# Patient Record
Sex: Female | Born: 1985 | Race: Black or African American | Hispanic: No | Marital: Single | Smoking: Never smoker
Health system: Southern US, Community
[De-identification: ages and names within clinical notes are randomized; demographics above are authoritative.]

## PROBLEM LIST (undated history)

## (undated) DIAGNOSIS — J45909 Unspecified asthma, uncomplicated: Secondary | ICD-10-CM

## (undated) DIAGNOSIS — I253 Aneurysm of heart: Secondary | ICD-10-CM

## (undated) DIAGNOSIS — F419 Anxiety disorder, unspecified: Secondary | ICD-10-CM

## (undated) DIAGNOSIS — R011 Cardiac murmur, unspecified: Secondary | ICD-10-CM

## (undated) DIAGNOSIS — D259 Leiomyoma of uterus, unspecified: Secondary | ICD-10-CM

## (undated) DIAGNOSIS — Q600 Renal agenesis, unilateral: Secondary | ICD-10-CM

## (undated) DIAGNOSIS — L309 Dermatitis, unspecified: Secondary | ICD-10-CM

---

## 2006-04-15 ENCOUNTER — Other Ambulatory Visit: Admission: RE | Admit: 2006-04-15 | Discharge: 2006-04-15 | Payer: Self-pay | Admitting: *Deleted

## 2006-09-21 ENCOUNTER — Emergency Department (HOSPITAL_COMMUNITY): Admission: EM | Admit: 2006-09-21 | Discharge: 2006-09-22 | Payer: Self-pay | Admitting: Emergency Medicine

## 2008-05-07 ENCOUNTER — Emergency Department (HOSPITAL_COMMUNITY): Admission: EM | Admit: 2008-05-07 | Discharge: 2008-05-07 | Payer: Self-pay | Admitting: Emergency Medicine

## 2008-07-11 LAB — CONVERTED CEMR LAB

## 2009-04-21 ENCOUNTER — Emergency Department (HOSPITAL_COMMUNITY): Admission: EM | Admit: 2009-04-21 | Discharge: 2009-04-22 | Payer: Self-pay | Admitting: Emergency Medicine

## 2009-04-26 ENCOUNTER — Emergency Department (HOSPITAL_COMMUNITY): Admission: EM | Admit: 2009-04-26 | Discharge: 2009-04-27 | Payer: Self-pay | Admitting: Emergency Medicine

## 2009-08-17 ENCOUNTER — Emergency Department (HOSPITAL_COMMUNITY): Admission: EM | Admit: 2009-08-17 | Discharge: 2009-08-18 | Payer: Self-pay | Admitting: Emergency Medicine

## 2009-08-21 ENCOUNTER — Encounter (INDEPENDENT_AMBULATORY_CARE_PROVIDER_SITE_OTHER): Payer: Self-pay | Admitting: *Deleted

## 2009-08-29 ENCOUNTER — Ambulatory Visit: Payer: Self-pay | Admitting: Family

## 2009-08-29 DIAGNOSIS — R609 Edema, unspecified: Secondary | ICD-10-CM

## 2009-08-29 DIAGNOSIS — L2089 Other atopic dermatitis: Secondary | ICD-10-CM

## 2009-08-29 DIAGNOSIS — IMO0001 Reserved for inherently not codable concepts without codable children: Secondary | ICD-10-CM

## 2009-08-29 DIAGNOSIS — R599 Enlarged lymph nodes, unspecified: Secondary | ICD-10-CM | POA: Insufficient documentation

## 2009-08-29 LAB — CONVERTED CEMR LAB
ANA Titer 1: NEGATIVE
HCV Ab: NEGATIVE
HDL: 61.1 mg/dL (ref >39.00)
Hepatitis B Surface Ag: NEGATIVE
LDL Cholesterol: 68 mg/dL (ref 0–99)
Rhuematoid fact SerPl-aCnc: 25.1 intl units/mL — ABNORMAL HIGH (ref 0.0–20.0)
Total CHOL/HDL Ratio: 2

## 2009-08-31 ENCOUNTER — Encounter: Payer: Self-pay | Admitting: Family

## 2009-09-08 ENCOUNTER — Encounter (INDEPENDENT_AMBULATORY_CARE_PROVIDER_SITE_OTHER): Payer: Self-pay | Admitting: *Deleted

## 2009-09-15 ENCOUNTER — Telehealth: Payer: Self-pay | Admitting: Family

## 2009-09-27 ENCOUNTER — Emergency Department (HOSPITAL_BASED_OUTPATIENT_CLINIC_OR_DEPARTMENT_OTHER): Admission: EM | Admit: 2009-09-27 | Discharge: 2009-09-27 | Payer: Self-pay | Admitting: Emergency Medicine

## 2009-09-27 ENCOUNTER — Ambulatory Visit: Payer: Self-pay | Admitting: Diagnostic Radiology

## 2009-10-02 ENCOUNTER — Encounter: Payer: Self-pay | Admitting: Family Medicine

## 2009-10-29 ENCOUNTER — Emergency Department (HOSPITAL_COMMUNITY): Admission: EM | Admit: 2009-10-29 | Discharge: 2009-10-30 | Payer: Self-pay | Admitting: Emergency Medicine

## 2009-11-02 ENCOUNTER — Telehealth: Payer: Self-pay | Admitting: Family

## 2009-11-03 ENCOUNTER — Encounter (INDEPENDENT_AMBULATORY_CARE_PROVIDER_SITE_OTHER): Payer: Self-pay | Admitting: *Deleted

## 2009-11-13 ENCOUNTER — Encounter: Payer: Self-pay | Admitting: Family

## 2009-11-24 ENCOUNTER — Ambulatory Visit (HOSPITAL_COMMUNITY): Admission: RE | Admit: 2009-11-24 | Discharge: 2009-11-24 | Payer: Self-pay | Admitting: Family

## 2009-11-24 ENCOUNTER — Ambulatory Visit: Payer: Self-pay

## 2009-11-24 ENCOUNTER — Encounter: Payer: Self-pay | Admitting: Internal Medicine

## 2009-11-24 ENCOUNTER — Ambulatory Visit: Payer: Self-pay | Admitting: Cardiology

## 2009-11-28 ENCOUNTER — Telehealth (INDEPENDENT_AMBULATORY_CARE_PROVIDER_SITE_OTHER): Payer: Self-pay | Admitting: *Deleted

## 2009-11-29 ENCOUNTER — Ambulatory Visit: Payer: Self-pay | Admitting: Family Medicine

## 2009-11-29 DIAGNOSIS — N644 Mastodynia: Secondary | ICD-10-CM | POA: Insufficient documentation

## 2009-11-29 DIAGNOSIS — I253 Aneurysm of heart: Secondary | ICD-10-CM

## 2009-12-01 ENCOUNTER — Telehealth (INDEPENDENT_AMBULATORY_CARE_PROVIDER_SITE_OTHER): Payer: Self-pay | Admitting: *Deleted

## 2009-12-01 LAB — CONVERTED CEMR LAB
BUN: 14 mg/dL (ref 6–23)
CO2: 28 meq/L (ref 19–32)
GFR calc non Af Amer: 118 mL/min (ref >60)
Glucose, Bld: 97 mg/dL (ref 70–99)

## 2009-12-19 ENCOUNTER — Encounter (INDEPENDENT_AMBULATORY_CARE_PROVIDER_SITE_OTHER): Payer: Self-pay | Admitting: *Deleted

## 2009-12-20 ENCOUNTER — Ambulatory Visit: Payer: Self-pay | Admitting: Family Medicine

## 2009-12-20 DIAGNOSIS — Q602 Renal agenesis, unspecified: Secondary | ICD-10-CM

## 2009-12-20 DIAGNOSIS — R0602 Shortness of breath: Secondary | ICD-10-CM

## 2009-12-20 DIAGNOSIS — Q605 Renal hypoplasia, unspecified: Secondary | ICD-10-CM

## 2009-12-21 LAB — CONVERTED CEMR LAB
BUN: 15 mg/dL (ref 6–23)
GFR calc non Af Amer: 119.77 mL/min (ref >60)
Glucose, Bld: 76 mg/dL (ref 70–99)
Potassium: 4.1 meq/L (ref 3.5–5.1)

## 2010-04-12 DIAGNOSIS — Z8679 Personal history of other diseases of the circulatory system: Secondary | ICD-10-CM | POA: Insufficient documentation

## 2010-04-12 DIAGNOSIS — H409 Unspecified glaucoma: Secondary | ICD-10-CM | POA: Insufficient documentation

## 2010-04-12 DIAGNOSIS — R002 Palpitations: Secondary | ICD-10-CM

## 2010-04-12 DIAGNOSIS — J45909 Unspecified asthma, uncomplicated: Secondary | ICD-10-CM | POA: Insufficient documentation

## 2010-04-13 ENCOUNTER — Ambulatory Visit: Payer: Self-pay | Admitting: Cardiology

## 2010-04-13 DIAGNOSIS — R079 Chest pain, unspecified: Secondary | ICD-10-CM

## 2010-04-13 DIAGNOSIS — M79609 Pain in unspecified limb: Secondary | ICD-10-CM

## 2010-07-10 NOTE — Letter (Signed)
Summary: Generic Letter  [place] at [facility]  [address]   [place], [place] [address]   Phone: [phone]  Fax: [phone]      11/13/2009    [address] [place], [place]  [address]   Dear Ms. Campisi, You were recently notified that your echocardiogram was scheduled for 11/24/09. We have been unable to reach you by phone and wanted you to know that Efraim Kaufmann needs to see you back in the office after you complete this test. Please call our office at [phone] Monday through Friday from 8am to 5pm to schedule this appointment.  Sincerely,   Mervin Kung CMA(AAMA)

## 2010-07-10 NOTE — Progress Notes (Signed)
Summary: Triage: Breast Pain  Phone Note Call from Patient Call back at Home Phone [phone]   Caller: Patient Summary of Call: Message left on VM: Patient will see Dr.Tabori tomorrow, patient with left breast pain and would like to know if Dr.Tabori can order breast u/s tomorrow. 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  I called patient back and left message informing her If she can squeeze her breast and feel the pain in can be concidered breast pain and can be addressed tomorrow (Per Dr.Tabori). If the pain is under the breast or inside chest wall it will be considered chest pain and she needs to be seen today at Urgent care or the Emergency room as quicky as possible to rule out heart related condition. Patient to call back if any questions or concerns reguarding this.    Chrae Malloy  November 28, 2009 1:41 PM   Follow-up for Phone Call        I held message to see if patient would call back with any concerns, will now sign off on, no return call  Follow-up by: Shonna Chock,  November 28, 2009 2:56 PM

## 2010-07-10 NOTE — Assessment & Plan Note (Signed)
Summary: palpitations/sob/mt   CC:  pt complains of symptoms of palpitations and sob. pt states since she been back on Birth Control pills shes been feeling better.  History of Present Illness: 25 year old female with no prior cardiac history for evaluation of palpitations and chest pain. Echocardiogram in June of 2011 showed normal LV function, atrial septal aneurysm, trivial mitral regurgitation. TSH in March of 2011 was normal. Patient complains of chest pain for the past one year. It is in the left upper chest and left shoulder. It improves with Motrin and Flexeril. It can last 2 hours to a half day at a time. He can increase with eating. It is not exertional. There is no associated symptoms. She also complains of bilateral lower extremity pain. This is worse when sleeping. It is not related to activities. She also notices some dyspnea but improved since beginning birth control pills. She occasionally feels her heart rate increased but there are no sustained palpitations. Because of the above we are asked to further evaluate.  Current Medications (verified): 1)  Furosemide 20 Mg Tabs (Furosemide) .... Take 1 Tab By Mouth As Needed For Swelling 2)  Ibuprofen 600 Mg Tabs (Ibuprofen) .... As Needed 3)  Flexeril 5 Mg Tabs (Cyclobenzaprine Hcl) .... Qid As Needed 4)  Body Ointment .... As Directed 5)  Yaz 3-0.02 Mg Tabs (Drospirenone-Ethinyl Estradiol) .... As Directed 6)  Face Oitment .... As Directed  Allergies: 1)  ! Fluzone (Influenza Virus Vaccine Split) 2)  ! * Eggs  Past History:  Past Medical History: SOLITARY KIDNEY, CONGENITAL GLAUCOMA  ASTHMA  eczema- managed at [facility]  Past Surgical History: Reviewed history from 08/29/2009 and no changes required. colposcopy- 2009 (negative per patient)  Family History: Reviewed history from 08/29/2009 and no changes required. Lung Cancer--maternal grandfather Heart disease-- father, MI at 28 HTN--maternal aunt Diabetes-- father  Mom-  Eczema, LE edema (doesn't see MD), anemia Brother- Eczema, DM2,  allergies, asthma 1/2 brother- history unknown  Social History: Reviewed history from 08/29/2009 and no changes required. Patient has never smoked.  Alcohol Use - yes Patient does not get regular exercise.  Works as Clinical biochemist at [employer]   Review of Systems       Bilateral lower extremity pain but no fevers or chills, productive cough, hemoptysis, dysphasia, odynophagia, melena, hematochezia, dysuria, hematuria, rash, seizure activity, orthopnea, PND,  claudication. Remaining systems are negative.   Vital Signs:  Patient profile:   25 year old female Height:      64 inches Weight:      250 pounds BMI:     43.07 Pulse rate:   70 / minute Resp:     14 per minute BP sitting:   109 / 76  (left arm)  Vitals Entered By: Kem Parkinson (April 13, 2010 2:35 PM)  Physical Exam  General:  Well developed/obese in NAD Skin significant for severe diffuse eczema Patient not depressed No peripheral clubbing Back-normal HEENT-normal/normal eyelids Neck supple/normal carotid upstroke bilaterally; no bruits; no JVD; no thyromegaly chest - CTA/ normal expansion CV - RRR/normal S1 and S2; no  rubs or gallops;  PMI nondisplaced; 1/6 systolic ejection murmur Abdomen -NT/ND, no HSM, no mass, + bowel sounds, no bruit 2+ femoral pulses, no bruits Ext-no edema, chords, 2+ DP Neuro-grossly nonfocal     EKG  Procedure date:  04/13/2010  Findings:      Sinus rhythm at a rate of 70. Axis normal. No ST changes.  Impression &  Recommendations:  Problem # 1:  CHEST PAIN (ICD-786.50) Symptoms most consistent with musculoskeletal pain as they are relieved with Motrin and Flexeril. Her symptoms are not consistent with cardiac pain. Echocardiogram normal in June and electrocardiogram normal. No further cardiac evaluation indicated.  Problem # 2:  PALPITATIONS (ICD-785.1) Not specifically limiting.  If they worsen in the future we could consider a monitor.  Problem # 3:  LEG PAIN (ICD-729.5) Symptoms not consistent with vascular issues. 2+ DP bilaterally. Question musculoskeletal. Further evaluation per primary care.  Problem # 4:  ECZEMA, ATOPIC (ICD-691.8)

## 2010-07-10 NOTE — Letter (Signed)
   [place] at [facility] [address] [place], [place]  [address]  [place] Phone: [phone]      August 31, 2009   [facility] [address] [place], [place] [address]  RE:  LAB RESULTS  Dear  Ms. Hing,  The following is an interpretation of your most recent lab tests.  Please take note of any instructions provided or changes to medications that have resulted from your lab work.   Your ANA and Rheumatoid factor are both abnormal.  These findings may be non-specific, but I would like for you to see Rheumatology.  They can investigate this further to help rule out any autoimmune issues.  You will be contacted about your appointment.    Sincerely Yours,    Lemont Fillers FNP

## 2010-07-10 NOTE — Progress Notes (Signed)
Summary: Missed Echo appt  Phone Note Call from Patient Call back at Home Phone [phone]   Caller: Patient Call For: Tina Foster Reason for Call: Referral Summary of Call: Pt was not able to make Echo appt for financial reasons, pt is ready for [place] to do a new referral, she is free all day 6/17 or is free after June 10th except on Tuesdays and Wednesdays Initial call taken by: Tina Foster,  Nov 02, 2009 3:59 PM  Follow-up for Phone Call        Tina Foster could you please arrange this?  Follow-up by: Tina Foster,  Nov 02, 2009 5:39 PM  Additional Follow-up for Phone Call Additional follow up Details #1::        Echo   re-scheduled  June 17  @ 4PM   L/M on phone    Additional Follow-up by: Tina Foster,  Nov 03, 2009 9:02 AM

## 2010-07-10 NOTE — Letter (Signed)
Summary: Primary Care Consult Scheduled Letter  [place] at [facility]  [address]   [place], [place] [address]   Phone: [phone]  Fax: [phone]      11/03/2009 MRN: [id number]  [facility] [address] [place], [place]  [address]    Dear Ms. Steil,      We have scheduled an appointment for you.  At the recommendation of MELISSA O'SULLIVAN FNP , we have scheduled you a 2D ECHO ,[facility]  on JUNE 17 ,2011  at 4PM .  Their address [address]  ,[place] . The office phone number is [phone] .  If this appointment day and time is not convenient for you, please feel free to call the office of the doctor you are being referred to at the number listed above and reschedule the appointment.     It is important for you to keep your scheduled appointments. We are here to make sure you are given good patient care. If you have questions or you have made changes to your appointment, please notify us at  [phone], ask for HELEN.    Thank you, Darral Dash  Patient Care Coordinator [place] at [facility]

## 2010-07-10 NOTE — Assessment & Plan Note (Signed)
Summary: FOLLOWUP AFTER HEART ECHO////SPH   Vital Signs:  Patient profile:   25 year old female Weight:      250 pounds Pulse rate:   73 / minute BP sitting:   126 / 70  (left arm)  Vitals Entered By: Doristine Devoid (November 29, 2009 1:20 PM) CC: L breast tenderness and f/u on echo   History of Present Illness: 25 yo woman here today for  1) f/u ECHO- atrial septal aneurysm  2) L breast pain- started 2 days ago.  pain w/ moving arm, touching the breast.  today pain is much less.  period started today.  admits to a lot of caffeine.  1st cousin has breast CA.  3) Edema-  worse w/ menses.  more noticeable since taking prednisone.  will complain that legs feel tight.  not limiting salt intake.  improves w/ elevation.  Current Medications (verified): 1)  Mometasone Furoate 0.1 % Oint (Mometasone Furoate) .... Apply Once Daily 2)  Clobetasol Propionate 0.05 % Oint (Clobetasol Propionate) .... Apply During Flare Ups 3)  Aquaphor  Oint (Emollient) .... Apply Daily As Needed.  Allergies (verified): 1)  ! Norco (Hydrocodone-Acetaminophen) 2)  ! Fluzone (Influenza Virus Vaccine Split) 3)  ! * Eggs  Past History:  Past Medical History: Asthma Glaucoma Heart Murmur HIV testing--2010 atrial septal aneurysm eczema- managed at [facility]  Review of Systems      See HPI  Physical Exam  General:  Well-developed,well-nourished,in no acute distress; alert,appropriate and cooperative throughout examination Breasts:  No mass, nodules, thickening, tenderness, bulging, retraction, inflamation, nipple discharge or skin changes noted.   Lungs:  Normal respiratory effort, chest expands symmetrically. Lungs are clear to auscultation, no crackles or wheezes. Heart:  Normal rate and regular rhythm. S1 and S2 normal without gallop, murmur, click, rub or other extra sounds. Pulses:  +2 carotid, radial, DP Extremities:  trace to +1 edema of LEs bilaterally   Impression & Recommendations:  Problem # 1:   OTHER ANEURYSM OF HEART (ICD-414.19) Assessment New pt's ECHO shows atrial septal aneurysm.  given risk of possible ASD or shunt will refer to cards for possible TEE.  will start ASA in the interim to lower risk of thromboembolic event.  check RPR to r/o syphilis as cause.  Pt expresses understanding and is in agreement w/ this plan. Orders: Venipuncture ([address]) T-RPR (Syphilis) ([id number]) Cardiology Referral (Cardiology)  Problem # 2:  BREAST PAIN, LEFT (ICD-611.71) Assessment: New likely menstrual related w/ normal breast exam.  NSAIDs as needed, limit caffeine.  Problem # 3:  EDEMA (ICD-782.3) Assessment: Unchanged localized.  not cardiac related given normal EF on ECHO.  check baseline labs and start Lasix as needed.  reviewed low salt diet, elevation, adequate hydration, regular exercise.  Pt expresses understanding and is in agreement w/ this plan. Orders: TLB-BMP (Basic Metabolic Panel-BMET) ([id number]-[id number])  Her updated medication list for this problem includes:    Furosemide 20 Mg Tabs (Furosemide) .Marland Kitchen... Take 1 tab by mouth as needed for swelling  Complete Medication List: 1)  Mometasone Furoate 0.1 % Oint (Mometasone furoate) .... Apply once daily 2)  Clobetasol Propionate 0.05 % Oint (Clobetasol propionate) .... Apply during flare ups 3)  Aquaphor Oint (Emollient) .... Apply daily as needed. 4)  Furosemide 20 Mg Tabs (Furosemide) .... Take 1 tab by mouth as needed for swelling  Patient Instructions: 1)  Please schedule a follow-up appointment in 2-3 weeks to recheck labs and follow up on swelling. 2)  Start an 81mg  Aspirin  daily 3)  Someone will call you with your cardiology appt 4)  Once we have your lab results we'll notify you about the fluid pill 5)  Drink plenty of fluids, elevate your legs as able, exercise regularly, limit your salt intake 6)  Hang in there!

## 2010-07-10 NOTE — Progress Notes (Signed)
Summary: New Rx--lm  Phone Note Call from Patient Call back at Home Phone [phone]   Caller: Patient Summary of Call: Pt states that she received Tramadol for a past visit to ER which helped with leg pain, she is still in pain especially when menses occurs and would like a Rx for Tramadol. Initial call taken by: Lannette Donath,  September 15, 2009 12:41 PM  Follow-up for Phone Call        she is due for follow up- I would like to see her back in the office.   In the meantime she can take Aleve 220mg  by mouth every 12 hours as needed for pain. Follow-up by: Lemont Fillers FNP,  September 15, 2009 12:53 PM  Additional Follow-up for Phone Call Additional follow up Details #1::        Left message on machine to return call  Mervin Kung CMA  September 15, 2009 2:08 PM   Spoke to pt. and advised her of instructions per Sandford Craze, NP.  Pt. 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  I advised pt. to call us for an appt. if she gets an earlier break, otherwise take Aleve as instructed.  Pt. voices understanding.  Mervin Kung CMA  September 15, 2009 3:31 PM

## 2010-07-10 NOTE — Letter (Signed)
Summary: Medication Reconciliation Letter  [place] [facility] [place]  [address] [place], [place] [address]   Phone: [phone]  Fax: [phone]     12/19/2009 MRN: [id number]  [facility] [address] [place], [place]  [address]  Dear [name],  We are glad to see you again today!  Please take a moment to review and update your medication list and Wellness questions then give this paper to the nurse when she calls you back for your appointment.Please include the over the counter medicines that you are currently taking.  Medication List:  MOMETASONE FUROATE 0.1 % OINT (MOMETASONE FUROATE) Apply once daily CLOBETASOL PROPIONATE 0.05 % OINT (CLOBETASOL PROPIONATE) Apply during flare ups AQUAPHOR  OINT (EMOLLIENT) Apply daily as needed. FUROSEMIDE 20 MG TABS (FUROSEMIDE) Take 1 tab by mouth as needed for swelling       Thank you

## 2010-07-10 NOTE — Progress Notes (Signed)
Summary: labs  Phone Note Outgoing Call   Call placed by: Doristine Devoid,  December 01, 2009 4:14 PM Call placed to: Patient Summary of Call: normal- not the cause of pt's atrial aneurysm  normal.  will recheck when she returns due to starting lasix   Follow-up for Phone Call        mailed information .........Marland KitchenDoristine Devoid  December 14, 2009 3:15 PM

## 2010-07-10 NOTE — Assessment & Plan Note (Signed)
Summary: rto 3 weeks.cbs   Vital Signs:  Patient profile:   25 year old female Weight:      253.50 pounds Pulse rate:   93 / minute Pulse rhythm:   regular BP sitting:   124 / 82  (left arm) Cuff size:   large  Vitals Entered By: Army Fossa CMA (December 20, 2009 1:33 PM) CC: Follow up visit, was seen in a UC for throat pain.    History of Present Illness: 25 yo woman here today for f/u on edema.  sxs somewhat improved, not taking Lasix regularly.  but cycle is approaching and she doesn't 'feel well'.  now on Flexeril and ibuprofen for menstrual related sxs (prescribed by UC).  previously on OCPs but ran out.  (sees Dr Normand Sloop)  breast pain- resolved but now returned due to impending cycle.  solitary kidney- congenital.  shortness of breath- only occurs intermittantly while lying on R side.  sxs improve when she rolls onto L side.  no sxs today.  no dyspnea w/ exertion.  no cough.  no wheezing.  Problems Prior to Update: 1)  Breast Pain, Left  (ICD-611.71) 2)  Other Aneurysm of Heart  (ICD-414.19) 3)  Eczema, Atopic  (ICD-691.8) 4)  Edema  (ICD-782.3) 5)  Preventive Health Care  (ICD-V70.0) 6)  Lymphadenopathy  (ICD-785.6) 7)  Myalgia  (ICD-729.1)  Current Medications (verified): 1)  Mometasone Furoate 0.1 % Oint (Mometasone Furoate) .... Apply Once Daily 2)  Clobetasol Propionate 0.05 % Oint (Clobetasol Propionate) .... Apply During Flare Ups 3)  Aquaphor  Oint (Emollient) .... Apply Daily As Needed. 4)  Furosemide 20 Mg Tabs (Furosemide) .... Take 1 Tab By Mouth As Needed For Swelling 5)  Ibuprofen 600 Mg Tabs (Ibuprofen) 6)  Flexeril 5 Mg Tabs (Cyclobenzaprine Hcl) .... Qid As Needed  Allergies: 1)  ! Fluzone (Influenza Virus Vaccine Split) 2)  ! * Eggs  Past History:  Past Medical History: Asthma Glaucoma Heart Murmur HIV testing--2010 atrial septal aneurysm eczema- managed at [facility] congenital solitary kidney  Review of Systems      See HPI  Physical  Exam  General:  Well-developed,well-nourished,in no acute distress; alert,appropriate and cooperative throughout examination Lungs:  Normal respiratory effort, chest expands symmetrically. Lungs are clear to auscultation, no crackles or wheezes. Heart:  Normal rate and regular rhythm. S1 and S2 normal without gallop, murmur, click, rub or other extra sounds. Pulses:  +2 carotid, radial, DP Extremities:  trace LE edema on R, +1 on L   Impression & Recommendations:  Problem # 1:  EDEMA (ICD-782.3) Assessment Unchanged pt has not been taking lasix regularly.  feels sxs improved but worsened as menses approaches.  check BMP due to lasix and new information that pt has solitary kidney. Her updated medication list for this problem includes:    Furosemide 20 Mg Tabs (Furosemide) .Marland Kitchen... Take 1 tab by mouth as needed for swelling  Orders: Venipuncture ([address]) TLB-BMP (Basic Metabolic Panel-BMET) (80048-METABOL)  Problem # 2:  BREAST PAIN, LEFT (ICD-611.71) Assessment: Unchanged initially improved but again worsened w/ impending menses.  stressed the importance of GYN f/u to regulate cycles.  Problem # 3:  SHORTNESS OF BREATH (ICD-786.05) Assessment: New intermittant.  only occurs when lying on R side and not every time she lies on that side.  reviewed anatomy and location of vena cava and venous return- given pt's habitus she is likely compressing these structures, thus her symptoms improve w/ change in position.  told pt to monitor sxs  and if they are worsening she is to let me know.  will follow.  Problem # 4:  SOLITARY KIDNEY, CONGENITAL (ICD-753.0) Assessment: New added to pt's chart.  stressed she needs to inform all her doctors of this.  Complete Medication List: 1)  Mometasone Furoate 0.1 % Oint (Mometasone furoate) .... Apply once daily 2)  Clobetasol Propionate 0.05 % Oint (Clobetasol propionate) .... Apply during flare ups 3)  Aquaphor Oint (Emollient) .... Apply daily as  needed. 4)  Furosemide 20 Mg Tabs (Furosemide) .... Take 1 tab by mouth as needed for swelling 5)  Ibuprofen 600 Mg Tabs (Ibuprofen) 6)  Flexeril 5 Mg Tabs (Cyclobenzaprine hcl) .... Qid as needed  Patient Instructions: 1)  Schedule a complete physical at your convenience- do not eat before this appt 2)  We'll notify you of your lab results 3)  Call and schedule w/ Dr Normand Sloop- we need to get those periods regulated! 4)  Don't be afraid to take the water pills as needed- we'll keep an eye on that kidney!

## 2010-07-10 NOTE — Letter (Signed)
Summary: New Patient Letter  [place] [facility] [place]  [address] [place], [place] [address]   Phone: [phone]  Fax: [phone]       08/21/2009 MRN: [id number]  [facility] [address] [place], [place]  [address]  Dear Tina Foster,   Welcome to [facility] and thank you for choosing [place] as your Primary Care Providers. Enclosed you will find information about our practice that we hope you find helpful. We have also enclosed forms to be filled out prior to your visit. This will provide [place] with the necessary information and facilitate your being seen in a timely manner. If you have any questions, please call us at: [phone] and we will be happy to assist you. We look forward to seeing you at your scheduled appointment time.  Appointment Tuesday, August 29, 2009 at 9:00am  with Sandford Craze, FNP                Sincerely,  Primary Health Care Team  Please arrive 15 minutes early for your first appointment and bring your insurance card. Co-pay is required at the time of your visit.  *****Please call the office if you are not able to keep this appointment. There is a charge of $50.00 if any appointment is not cancelled or rescheduled within 24 hours*****

## 2010-07-10 NOTE — Consult Note (Signed)
Summary: [facility]   Imported By: Lanelle Bal 10/17/2009 13:58:48  _____________________________________________________________________  External Attachment:    Type:   Image     Comment:   External Document

## 2010-07-10 NOTE — Letter (Signed)
Summary: Primary Care Consult Scheduled Letter  [place] [facility] [place]  [address] [place], [place] [address]   Phone: [phone]  Fax: [phone]      09/08/2009 MRN: [id number]  [facility] [address] [place], [place]  [address]    Dear Ms. Katzenstein,    We have scheduled an appointment for you.  At the recommendation of Sandford Craze, FNP, we have scheduled you a consult with Dr. Dierdre Forth of [facility] on 10-30-2009 at 3:00pm.  Their address is [address], [place] [address]. The office phone number is [phone].  If this appointment day and time is not convenient for you, please feel free to call the office of the doctor you are being referred to at the number listed above and reschedule the appointment.    It is important for you to keep your scheduled appointments. We are here to make sure you are given good patient care.   Thank you,    Renee, Patient Care Coordinator [place] at [facility]

## 2010-07-10 NOTE — Progress Notes (Signed)
Summary: f/u after echo, mailed letter  Phone Note Outgoing Call   Summary of Call: Tina Foster- could you please coordinate with Myriam Jacobson to schedule patient for a follow up visit after her echocardiogram. Initial call taken by: Lemont Fillers FNP,  Nov 02, 2009 5:41 PM  Follow-up for Phone Call        Tina Foster , Echo has been re-scheduled   June  17  @  4Pm  .  L/M  for pt to return my call Follow-up by: Darral Dash,  Nov 03, 2009 9:04 AM  Additional Follow-up for Phone Call Additional follow up Details #1::        Left message on machine to return my call.  Mervin Kung CMA  November 08, 2009 9:23 AM   Left message on machine to return my call. Mervin Kung CMA  November 13, 2009 10:46 AM   Unable to reach pt. by phone, mailed letter to pt to call and schedule f/u/.  Mervin Kung CMA  November 13, 2009 4:41 PM

## 2010-07-10 NOTE — Assessment & Plan Note (Signed)
Summary: NEW TO EST,BCBS INS/RH...........Marland Kitchen   Vital Signs:  Patient profile:   25 year old female Height:      64.5 inches Weight:      248.6 pounds BMI:     42.16 Temp:     98.1 degrees F oral Pulse rate:   76 / minute Pulse rhythm:   regular Resp:     18 per minute BP sitting:   130 / 72  (right arm) Cuff size:   large  Vitals Entered By: Mervin Kung CMA (August 29, 2009 9:12 AM) Is Patient Diabetic? No   History of Present Illness: Tina Foster is a 25 year old female who presents today to establish care.  She was seen in the [facility] ED on 08/17/09 for L arm pain and bilateral LE pain.  She was noted to have very elevated D. Dimer.  CTA chest was performed and was negative for PE, but did note bilateral axillary LAD.  Previously she had a a CT abdomen in november due to abdominal pain- CT at that time noted bilateral inguinal LAD.  Patient has history of severe eczema.    She has not had a primary care provider in 2-3 years.  Has seen GYN, due for pap  Dermatology-  sees Marquis Buggy at [facility] for her Eczema.   GYN- has history of abnormal pap, sees Naima Dilliard- GYN  Preventive Screening-Counseling & Management  Alcohol-Tobacco     Alcohol drinks/day: <1     Alcohol type: liquor     Smoking Status: never  Caffeine-Diet-Exercise     Caffeine use/day: 2 daily     Does Patient Exercise: no  Allergies (verified): 1)  ! Norco (Hydrocodone-Acetaminophen) 2)  ! Fluzone (Influenza Virus Vaccine Split) 3)  ! * Eggs  Past History:  Past Medical History: Asthma Glaucoma Heart Murmur HIV testing--2010  Past Surgical History: colposcopy- 2009 (negative per patient)  Family History: Lung Cancer--maternal grandfather Heart disease-- father HTN--maternal aunt Diabetes-- father  Mom- Eczema, LE edema (doesn't see MD), anemia Brother- Eczema, DM2,  allergies, asthma 1/2 brother- history unknown Dad- MI,  HTN,  DM2  Social History: Reviewed history and no  changes required. Patient has never smoked.  Alcohol Use - yes Patient does not get regular exercise.  Works as Clinical biochemist at [employer] Smoking Status:  never Caffeine use/day:  2 daily Does Patient Exercise:  no  Review of Systems       + muscle aches in bilateral lower extremities and L arm- unchanged.  No aggravating or alleviating factors.   No aggravating factors.   Notes + feet swelling at end of the day. + fevers (low grade) 99.5,  now resolved.  Notes mild nausea, no vomitting or diarrhea.  denies current chest pain or shortness of breath.  Notes + weeping eczema at belt line- often has foul smell  Physical Exam  General:  Well-developed,well-nourished,in no acute distress; alert,appropriate and cooperative throughout examination Lungs:  Normal respiratory effort, chest expands symmetrically. Lungs are clear to auscultation, no crackles or wheezes. Heart:  Normal rate and regular rhythm. S1 and S2 normal without gallop, murmur, click, rub or other extra sounds. + JVD noted Abdomen:  Bowel sounds positive,abdomen soft and non-tender without masses, organomegaly or hernias noted. Extremities:  No clubbing, cyanosis, edema, or deformity noted with normal full range of motion of all joints.   Skin:  tight/dry hyperpigmented eczematous patches  noted on all extremities including face.  Also present  on trunk,  Belt line is erythematous without clear odor or notable weeping at present. Axillary Nodes:  +palpable Right axillary lymph node   Impression & Recommendations:  Problem # 1:  MYALGIA (ICD-729.1) Assessment New I am concerned about autoimmune etiology.  Will check basic autoimmune labs.   Orders: Venipuncture (34742) TLB-Sedimentation Rate (ESR) (85652-ESR) TLB-TSH (Thyroid Stimulating Hormone) (84443-TSH) TLB-Rheumatoid Factor (RA) (59563-OV) T-ANA (56433-29518)  Problem # 2:  EDEMA (ICD-782.3) Assessment: Comment Only Notes chronic intermittent  edema, + JVD noted on exam, will check 2-D echo.   Orders: Echo Referral (Echo)  Problem # 3:  ECZEMA, ATOPIC (ICD-691.8) Assessment: Comment Only Severe case of ezcema.  Follows with Dermatology.  Due to weeping on abdomen and patient's c/o foul odor, will treat empirically with keflex.   Her updated medication list for this problem includes:    Mometasone Furoate 0.1 % Oint (Mometasone furoate) .Marland Kitchen... Apply once daily    Clobetasol Propionate 0.05 % Oint (Clobetasol propionate) .Marland Kitchen... Apply during flare ups    Aquaphor Oint (Emollient) .Marland Kitchen... Apply daily as needed.  Problem # 4:  LYMPHADENOPATHY (ICD-785.6) Assessment: New It is certainly possible that her LAD is reactive to her severe eczema, however will plan to screen for HIV.  Will monitor, if LAD persists can consider biopsy.   Her updated medication list for this problem includes:    Keflex 500 Mg Caps (Cephalexin) ..... One cap by mouth two times a day x 7 days  Orders: T-HIV-1 (Screen) (84166) T-Hepatitis Acute Panel ([id number])  Complete Medication List: 1)  Mometasone Furoate 0.1 % Oint (Mometasone furoate) .... Apply once daily 2)  Clobetasol Propionate 0.05 % Oint (Clobetasol propionate) .... Apply during flare ups 3)  Aquaphor Oint (Emollient) .... Apply daily as needed. 4)  Keflex 500 Mg Caps (Cephalexin) .... One cap by mouth two times a day x 7 days  Other Orders: TLB-Lipid Panel (80061-LIPID)  Patient Instructions: 1)  Please follow up in 2 weeks.   2)  You are welcome to [place] 3)  Nature conservation officer at [facility] 4)  [address])  [place], [place] [address] 6)  [phone] Prescriptions: KEFLEX 500 MG CAPS (CEPHALEXIN) one cap by mouth two times a day x 7 days  #14 x 0   Entered and Authorized by:   Lemont Fillers FNP   Signed by:   Lemont Fillers FNP on 08/29/2009   Method used:   Electronically to        [facility]. [phone]* (retail)        [address]       [place], [place]  [address]       Ph: [phone]       Fax: [phone]   RxID:   [id number]    Preventive Care Screening  Last Tetanus Booster:    Date:  05/10/2009    Results:  Historical   Pap Smear:    Date:  07/11/2008    Results:  unsure      Contraindications/Deferment of Procedures/Staging:    Test/Procedure: FLU VAX    Reason for deferment: patient declined   Current Allergies (reviewed today): ! NORCO (HYDROCODONE-ACETAMINOPHEN) ! FLUZONE (INFLUENZA VIRUS VACCINE SPLIT) ! * EGGS   Vital Signs:  Patient Profile:   25 year old female Height:     64.5 inches Weight:      248.6 pounds  BMI:     42.16 Temp:     98.1 degrees F oral Pulse rate:   76 / minute Pulse rhythm:   regular Resp:     18 per minute BP sitting:   130 / 72 Cuff size:   large

## 2010-08-04 ENCOUNTER — Emergency Department (HOSPITAL_COMMUNITY): Payer: BC Managed Care – PPO

## 2010-08-04 ENCOUNTER — Emergency Department (HOSPITAL_COMMUNITY)
Admission: EM | Admit: 2010-08-04 | Discharge: 2010-08-04 | Disposition: A | Discharge status: Home / Self Care | Payer: BC Managed Care – PPO | Attending: Emergency Medicine | Admitting: Emergency Medicine

## 2010-08-04 DIAGNOSIS — J45909 Unspecified asthma, uncomplicated: Secondary | ICD-10-CM | POA: Insufficient documentation

## 2010-08-04 DIAGNOSIS — K5900 Constipation, unspecified: Secondary | ICD-10-CM | POA: Insufficient documentation

## 2010-08-04 DIAGNOSIS — R1031 Right lower quadrant pain: Secondary | ICD-10-CM | POA: Insufficient documentation

## 2010-08-04 DIAGNOSIS — R110 Nausea: Secondary | ICD-10-CM | POA: Insufficient documentation

## 2010-08-04 DIAGNOSIS — R109 Unspecified abdominal pain: Secondary | ICD-10-CM | POA: Insufficient documentation

## 2010-08-04 DIAGNOSIS — M069 Rheumatoid arthritis, unspecified: Secondary | ICD-10-CM | POA: Insufficient documentation

## 2010-08-04 LAB — CBC
HCT: 36.9 % (ref 36.0–46.0)
Hemoglobin: 12.3 g/dL (ref 12.0–15.0)
MCH: 28.9 pg (ref 26.0–34.0)

## 2010-08-04 LAB — COMPREHENSIVE METABOLIC PANEL
AST: 22 U/L (ref 0–37)
Albumin: 3.4 g/dL — ABNORMAL LOW (ref 3.5–5.2)
CO2: 26 mEq/L (ref 19–32)
Chloride: 106 mEq/L (ref 96–112)
GFR calc non Af Amer: >60 mL/min (ref >60)
Glucose, Bld: 97 mg/dL (ref 70–99)
Potassium: 4 mEq/L (ref 3.5–5.1)
Total Protein: 7.5 g/dL (ref 6.0–8.3)

## 2010-08-04 LAB — WET PREP, GENITAL
Clue Cells Wet Prep HPF POC: NONE SEEN
Trich, Wet Prep: NONE SEEN
Yeast Wet Prep HPF POC: NONE SEEN

## 2010-08-04 LAB — DIFFERENTIAL
Basophils Absolute: 0.1 10*3/uL (ref 0.0–0.1)
Eosinophils Relative: 9 % — ABNORMAL HIGH (ref 0–5)
Lymphocytes Relative: 17 % (ref 12–46)
Lymphs Abs: 1.6 10*3/uL (ref 0.7–4.0)
Monocytes Absolute: 0.6 10*3/uL (ref 0.1–1.0)
Monocytes Relative: 7 % (ref 3–12)

## 2010-08-04 LAB — URINALYSIS, ROUTINE W REFLEX MICROSCOPIC
Bilirubin Urine: NEGATIVE
Hgb urine dipstick: NEGATIVE
Nitrite: NEGATIVE
Urobilinogen, UA: 1 mg/dL (ref 0.0–1.0)
pH: 6 (ref 5.0–8.0)

## 2010-08-04 MED ORDER — IOHEXOL 300 MG/ML  SOLN
125.0000 mL | Freq: Once | INTRAMUSCULAR | Status: AC | PRN
  Administered 2010-08-04: 125 mL via INTRAVENOUS

## 2010-08-06 LAB — GC/CHLAMYDIA PROBE AMP, GENITAL
Chlamydia, DNA Probe: NEGATIVE
GC Probe Amp, Genital: NEGATIVE

## 2010-08-27 LAB — URINALYSIS, ROUTINE W REFLEX MICROSCOPIC
Glucose, UA: NEGATIVE mg/dL
Hgb urine dipstick: NEGATIVE
Ketones, ur: 15 mg/dL — AB

## 2010-08-28 LAB — POCT CARDIAC MARKERS
CKMB, poc: <1 ng/mL — ABNORMAL LOW (ref 1.0–8.0)
Troponin i, poc: <0.05 ng/mL (ref 0.00–0.09)

## 2010-08-28 LAB — BASIC METABOLIC PANEL
BUN: 16 mg/dL (ref 6–23)
Calcium: 9 mg/dL (ref 8.4–10.5)
Creatinine, Ser: 0.7 mg/dL (ref 0.4–1.2)
GFR calc non Af Amer: >60 mL/min (ref >60)
Potassium: 3.6 mEq/L (ref 3.5–5.1)

## 2010-08-28 LAB — CBC
HCT: 35.5 % — ABNORMAL LOW (ref 36.0–46.0)
Platelets: 300 10*3/uL (ref 150–400)
RBC: 4.03 MIL/uL (ref 3.87–5.11)
WBC: 9.3 10*3/uL (ref 4.0–10.5)

## 2010-08-28 LAB — D-DIMER, QUANTITATIVE: D-Dimer, Quant: 1.3 ug/mL-FEU — ABNORMAL HIGH (ref 0.00–0.48)

## 2010-08-28 LAB — DIFFERENTIAL
Eosinophils Relative: 13 % — ABNORMAL HIGH (ref 0–5)
Lymphocytes Relative: 23 % (ref 12–46)
Lymphs Abs: 2.2 10*3/uL (ref 0.7–4.0)
Monocytes Relative: 7 % (ref 3–12)

## 2010-09-02 LAB — POCT I-STAT, CHEM 8
BUN: 8 mg/dL (ref 6–23)
Calcium, Ion: 1.1 mmol/L — ABNORMAL LOW (ref 1.12–1.32)
Creatinine, Ser: 0.6 mg/dL (ref 0.4–1.2)
Glucose, Bld: 93 mg/dL (ref 70–99)
Hemoglobin: 12.6 g/dL (ref 12.0–15.0)
Sodium: 139 mEq/L (ref 135–145)
TCO2: 23 mmol/L (ref 0–100)

## 2010-09-02 LAB — CBC
Hemoglobin: 12.5 g/dL (ref 12.0–15.0)
Platelets: 275 10*3/uL (ref 150–400)
RDW: 12.6 % (ref 11.5–15.5)

## 2010-09-02 LAB — DIFFERENTIAL
Basophils Relative: 0 % (ref 0–1)
Lymphocytes Relative: 21 % (ref 12–46)
Neutro Abs: 5.1 10*3/uL (ref 1.7–7.7)

## 2010-09-12 LAB — URINALYSIS, ROUTINE W REFLEX MICROSCOPIC
Bilirubin Urine: NEGATIVE
Glucose, UA: NEGATIVE mg/dL
Hgb urine dipstick: NEGATIVE
Ketones, ur: NEGATIVE mg/dL
Protein, ur: NEGATIVE mg/dL
Urobilinogen, UA: 1 mg/dL (ref 0.0–1.0)

## 2010-09-12 LAB — DIFFERENTIAL
Basophils Absolute: 0.1 10*3/uL (ref 0.0–0.1)
Basophils Relative: 1 % (ref 0–1)
Eosinophils Absolute: 0.3 10*3/uL (ref 0.0–0.7)
Monocytes Absolute: 0.7 10*3/uL (ref 0.1–1.0)
Monocytes Relative: 8 % (ref 3–12)

## 2010-09-12 LAB — COMPREHENSIVE METABOLIC PANEL
ALT: 15 U/L (ref 0–35)
Albumin: 3.8 g/dL (ref 3.5–5.2)
Alkaline Phosphatase: 64 U/L (ref 39–117)
BUN: 7 mg/dL (ref 6–23)
Chloride: 105 mEq/L (ref 96–112)
Glucose, Bld: 89 mg/dL (ref 70–99)
Potassium: 4.1 mEq/L (ref 3.5–5.1)
Sodium: 136 mEq/L (ref 135–145)
Total Bilirubin: 0.8 mg/dL (ref 0.3–1.2)
Total Protein: 7.2 g/dL (ref 6.0–8.3)

## 2010-09-12 LAB — CBC
HCT: 36.8 % (ref 36.0–46.0)
Hemoglobin: 12.5 g/dL (ref 12.0–15.0)
Platelets: 308 10*3/uL (ref 150–400)
RDW: 12.1 % (ref 11.5–15.5)
WBC: 9.1 10*3/uL (ref 4.0–10.5)

## 2010-09-12 LAB — POCT PREGNANCY, URINE: Preg Test, Ur: NEGATIVE

## 2010-09-12 LAB — URINE MICROSCOPIC-ADD ON

## 2011-08-22 ENCOUNTER — Encounter (INDEPENDENT_AMBULATORY_CARE_PROVIDER_SITE_OTHER): Payer: BC Managed Care – PPO

## 2011-08-22 ENCOUNTER — Encounter: Payer: BC Managed Care – PPO | Admitting: Obstetrics and Gynecology

## 2011-08-22 DIAGNOSIS — R87612 Low grade squamous intraepithelial lesion on cytologic smear of cervix (LGSIL): Secondary | ICD-10-CM

## 2012-02-06 IMAGING — CR DG CHEST 2V
2 series · 2 of 2 positions shown · non-contrast
Comparison: 08/17/2009

CLINICAL DATA: Chest pain.  Shortness of breath.  Diabetes.
Hypertension.  Rheumatoid arthropathy.

CHEST - 2 VIEW

[w chest pa]
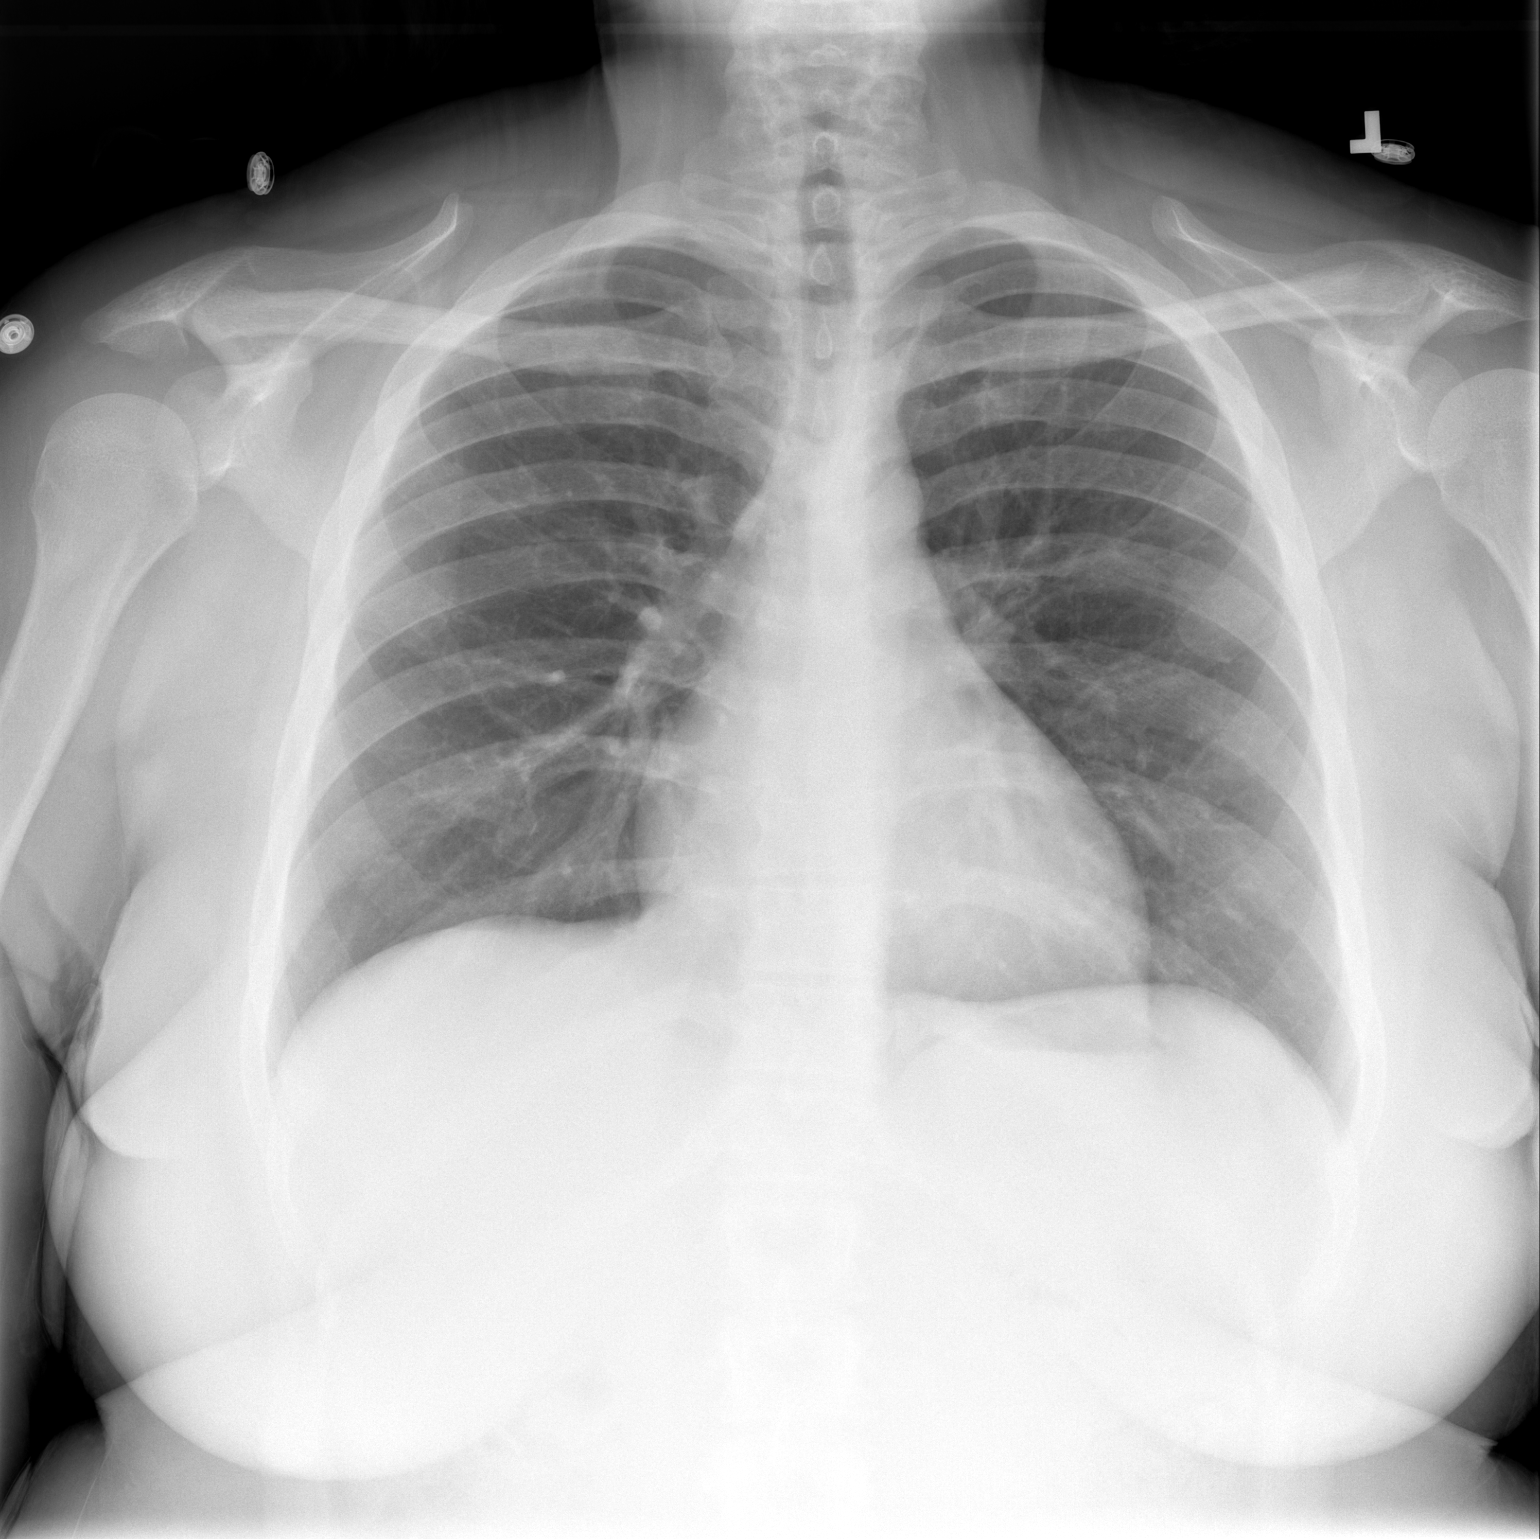

[w chest lat]
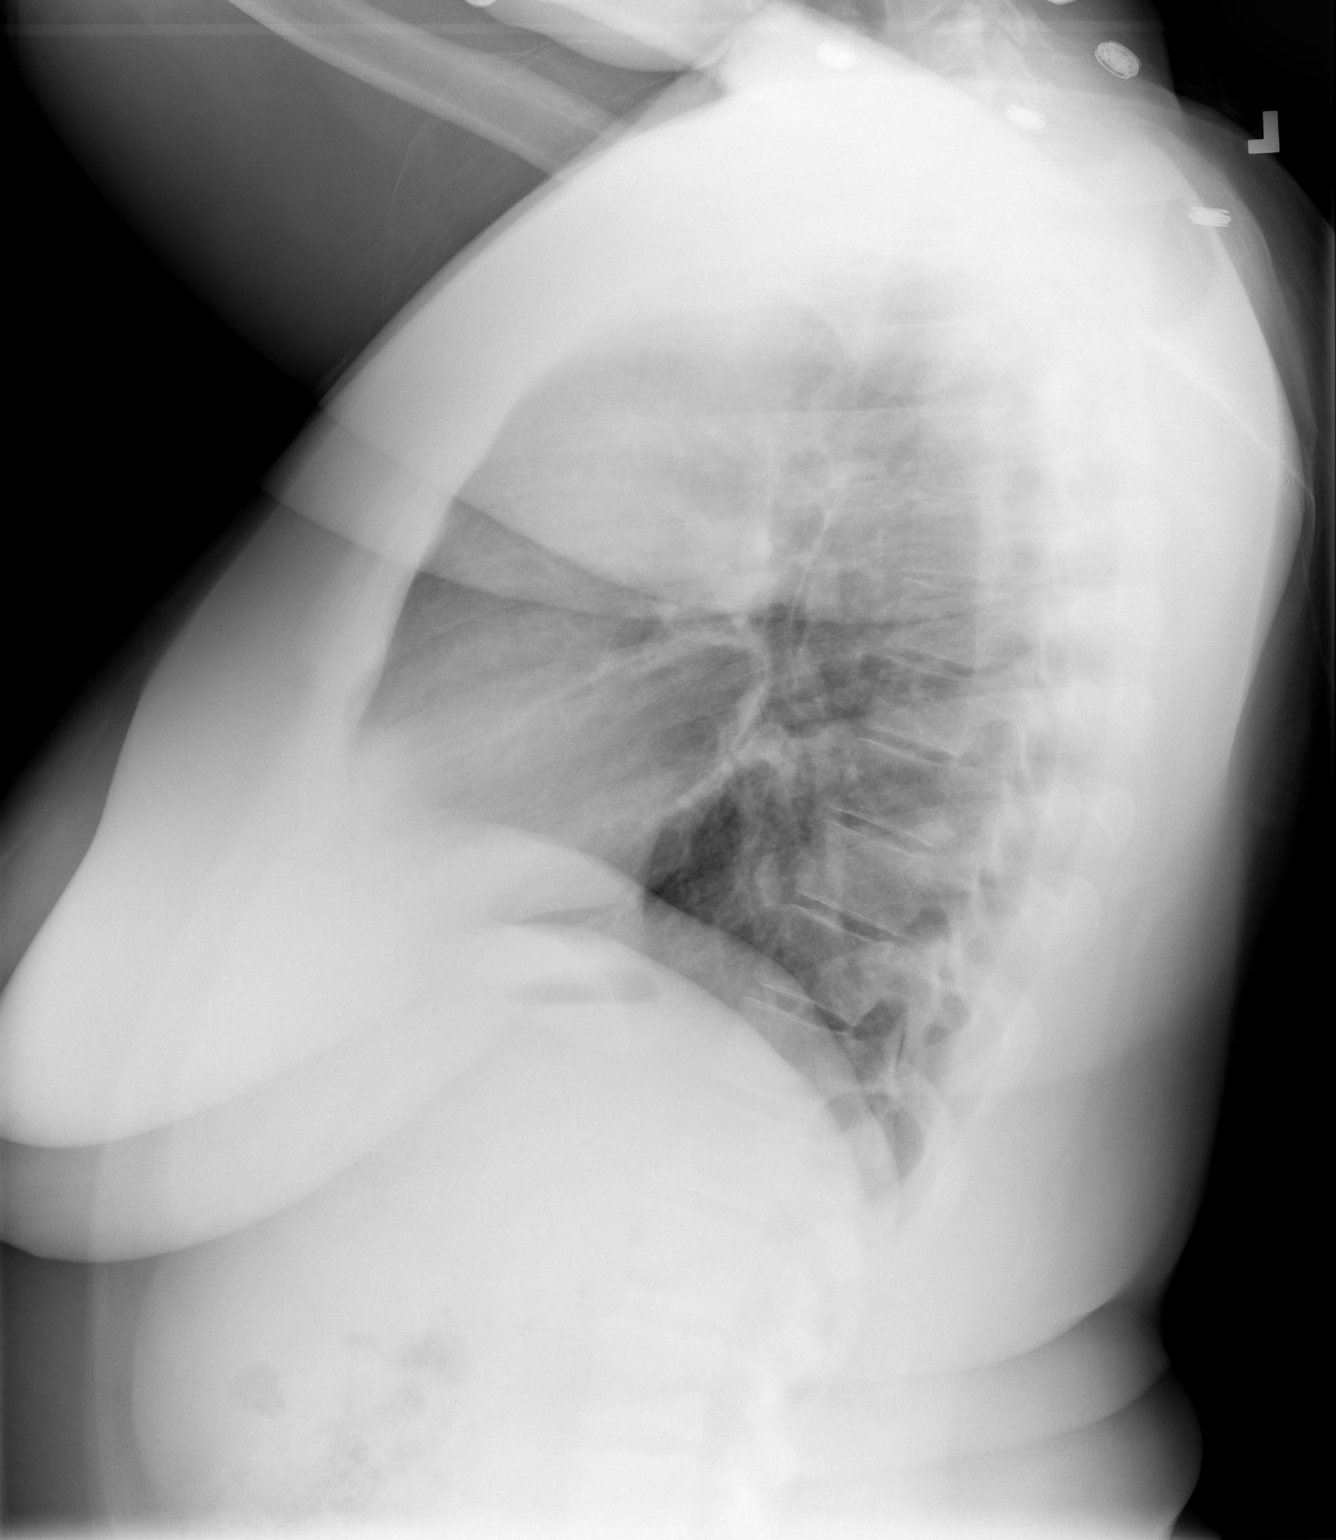

[2 of 2 positions shown; findings below may reference images not displayed]

FINDINGS: Cardiac and mediastinal contours appear normal.

The lungs appear clear.

No pleural effusion is identified.
IMPRESSION: No significant abnormality identified.

## 2021-01-19 ENCOUNTER — Other Ambulatory Visit: Payer: Self-pay | Admitting: Interventional Radiology

## 2021-01-19 DIAGNOSIS — D259 Leiomyoma of uterus, unspecified: Secondary | ICD-10-CM

## 2021-03-01 ENCOUNTER — Other Ambulatory Visit: Payer: Self-pay | Admitting: Interventional Radiology

## 2021-03-01 ENCOUNTER — Encounter: Payer: Self-pay | Admitting: *Deleted

## 2021-03-01 ENCOUNTER — Ambulatory Visit
Admission: RE | Admit: 2021-03-01 | Discharge: 2021-03-01 | Disposition: A | Discharge status: Home / Self Care | Payer: BC Managed Care – PPO | Source: Ambulatory Visit | Attending: Interventional Radiology | Admitting: Interventional Radiology

## 2021-03-01 DIAGNOSIS — D259 Leiomyoma of uterus, unspecified: Secondary | ICD-10-CM

## 2021-03-01 HISTORY — PX: IR RADIOLOGIST EVAL & MGMT: IMG5224

## 2021-03-01 NOTE — Consult Note (Signed)
Chief Complaint: Patient was seen in consultation today for symptomatic uterine fibroids at the request of Dr. Crawford Givens.  Referring Physician(s): Dr. Crawford Givens  History of Present Illness: Tina Foster is a 35 y.o. G44P0 African-American female with a known history of multiple uterine fibroids by ultrasound since approximately 2019.  She describes pelvic pain and pressure which is worse during her menstrual cycle.  Bulk symptoms consist of nocturia waking up approximately twice per night to urinate.  Menstrual cycles are regular every 28 days and last 7 days with 2 days of heavy bleeding with some passage of clots.  Last menstrual cycle was just completed yesterday.  She uses pads.  She denies any bleeding between cycles.  She was previously on oral contraceptives but stopped using them 5 months ago.  She has not had any prior hormonal therapy for fibroids or prior fibroid surgery.  She has a history of herpes simplex 2.  Pap smear on 01/03/2021 was negative.  Office ultrasound dated 10/01/2017 demonstrates 4 measured fibroids with the largest measuring approximately 9.5 x 7.3 x 8.4 cm and smaller fibroids in the range of 1.4-3.0 cm in maximum diameter.  Ovaries appeared normal at that time by ultrasound.  History of solitary right kidney since birth.  She lives in [place], [place] and works as an Passenger transport manager for [employer].  She also works part-time in Biochemist, clinical.  She currently has no plans for childbirth but has not completely excluded the possibility of having a child in the future.  Past Medical History: Eczema Solitary right kidney Asthma as child Genital herpes simplex  Allergies: Eggs or egg-derived products and Fluzone  Medications: Prior to Admission medications   Not on File    Family History: Father with diabetes and hypertension  Social History   Socioeconomic History   Marital status: Single    Spouse name: Not on file    Number of children: Not on file   Years of education: Not on file   Highest education level: Not on file  Occupational History   Not on file  Tobacco Use   Smoking status: Not on file   Smokeless tobacco: Not on file  Substance and Sexual Activity   Alcohol use: Not on file   Drug use: Not on file   Sexual activity: Not on file  Other Topics Concern   Not on file  Social History Narrative   Not on file   Social Determinants of Health   Financial Resource Strain: Not on file  Food Insecurity: Not on file  Transportation Needs: Not on file  Physical Activity: Not on file  Stress: Not on file  Social Connections: Not on file     Review of Systems: A 12 point ROS discussed and pertinent positives are indicated in the HPI above.  All other systems are negative.  Review of Systems  Constitutional: Negative.   Respiratory: Negative.    Cardiovascular: Negative.   Gastrointestinal: Negative.   Genitourinary:  Positive for frequency, menstrual problem and pelvic pain.  Musculoskeletal: Negative.   Skin:  Positive for rash.  Neurological: Negative.    Vital Signs: There were no vitals taken for this visit.  Physical Exam Constitutional:      General: She is not in acute distress.    Appearance: She is obese. She is not ill-appearing, toxic-appearing or diaphoretic.  HENT:     Head: Normocephalic and atraumatic.  Cardiovascular:     Rate and Rhythm: Normal  rate and regular rhythm.     Heart sounds: Normal heart sounds. No murmur heard.   No gallop.  Pulmonary:     Effort: Pulmonary effort is normal. No respiratory distress.     Breath sounds: Normal breath sounds. No stridor. No wheezing, rhonchi or rales.  Abdominal:     General: There is no distension.     Palpations: Abdomen is soft.     Tenderness: There is no abdominal tenderness. There is no guarding or rebound.     Hernia: No hernia is present.     Comments: Palpable firmness in midline suprapubic pelvis and  to left of midline likely representing palpable uterus.  Musculoskeletal:        General: No swelling.     Cervical back: Neck supple.  Skin:    General: Skin is warm and dry.     Findings: Rash present.  Neurological:     General: No focal deficit present.     Mental Status: She is alert and oriented to person, place, and time.     Imaging: IR Radiologist Eval & Mgmt  Result Date: 03/01/2021 Please refer to notes tab for details about interventional procedure. (Op Note)   Labs: Cr 0.7 and GFR 114 mL/min on 09/29/2020  Assessment and Plan:  I met with [name] and we discussed discussed options for treatment of her symptomatic uterine fibroids including hysterectomy and uterine fibroid embolization.  She clearly has an indication for treatment given menorrhagia as well as bulk symptoms of nocturia and pelvic pain and pressure.   I discussed the need for MRI of the pelvis to fully characterize uterine fibroid morphology, enhancement pattern, uterine volume and assess the ovaries prior to determining whether she is definitely a candidate for embolization. Details of uterine fibroid embolization were discussed with the patient including technical details of the procedure, expected outcomes, typical recovery and need for overnight observation.  Advantages of hysterectomy were also discussed with the patient including more definitive outcome of eliminating menstrual cycles and bulk symptoms.  Ms. Rivenbark does not want to have a hysterectomy currently.  She has 2 friends that have had prior fibroid embolization with successful outcomes.  I did discuss the issue of future fertility after fibroid embolization and the fact that the chance of becoming pregnant may become decreased after embolization.  However, with multiple uterine fibroids, she would be considered at higher risk of miscarriage during pregnancy.  She is aware of the risk of diminished fertility and would like to pursue  embolization due to her symptoms.  After the pelvic MRI, I will get back in touch with Ms. Mccary to discuss results and determine whether she is a candidate for embolization.  If she is, we will begin the authorization and scheduling process at that time.   Thank you for this interesting consult.  I greatly enjoyed meeting Camilia Caywood and look forward to participating in their care.  A copy of this report was sent to the requesting provider on this date.  Electronically Signed: Azzie Roup 03/01/2021, 4:25 PM    I spent a total of  40 Minutes  in face to face in clinical consultation, greater than 50% of which was counseling/coordinating care for uterine fibroids.

## 2021-03-10 ENCOUNTER — Other Ambulatory Visit: Payer: Self-pay

## 2021-03-10 ENCOUNTER — Ambulatory Visit
Admission: RE | Admit: 2021-03-10 | Discharge: 2021-03-10 | Disposition: A | Discharge status: Home / Self Care | Payer: BC Managed Care – PPO | Source: Ambulatory Visit | Attending: Interventional Radiology | Admitting: Interventional Radiology

## 2021-03-10 DIAGNOSIS — D259 Leiomyoma of uterus, unspecified: Secondary | ICD-10-CM | POA: Diagnosis not present

## 2021-03-10 MED ORDER — GADOBUTROL 1 MMOL/ML IV SOLN
10.0000 mL | Freq: Once | INTRAVENOUS | Status: AC | PRN
  Administered 2021-03-10: 10 mL via INTRAVENOUS

## 2021-03-19 ENCOUNTER — Other Ambulatory Visit: Payer: Self-pay | Admitting: Interventional Radiology

## 2021-03-19 DIAGNOSIS — D259 Leiomyoma of uterus, unspecified: Secondary | ICD-10-CM

## 2021-03-22 ENCOUNTER — Other Ambulatory Visit: Payer: Self-pay

## 2021-03-22 ENCOUNTER — Ambulatory Visit
Admission: RE | Admit: 2021-03-22 | Discharge: 2021-03-22 | Disposition: A | Discharge status: Home / Self Care | Payer: BC Managed Care – PPO | Source: Ambulatory Visit | Attending: Interventional Radiology | Admitting: Interventional Radiology

## 2021-03-22 ENCOUNTER — Encounter: Payer: Self-pay | Admitting: *Deleted

## 2021-03-22 DIAGNOSIS — D259 Leiomyoma of uterus, unspecified: Secondary | ICD-10-CM

## 2021-03-22 HISTORY — PX: IR RADIOLOGIST EVAL & MGMT: IMG5224

## 2021-03-22 NOTE — Progress Notes (Signed)
Interventional Radiology Progress Note  I called Tina Foster to review findings from the MRI of the pelvis performed on 03/10/2021.  By my measurements, the uterus measures approximately 14.8 x 20.4 x 8.7 cm for estimated volume of 1,366 mL.  There are multiple uterine fibroids, predominantly clustered at the level of the fundus and confluent in nature.  Dominant fibroid measures as much as 12 cm in estimated maximal diameter.  Multiple other smaller fibroids are present in the mid to lower uterine body.  There are no pedunculated fibroids.  One small 10 mm fibroid projects into the lower endometrial canal but otherwise none of the other fibroids is significantly within the endometrial cavity.  The endometrial stripe is distorted at the level of the fundus.  All of the uterine fibroid tissue enhances after administration of gadolinium.  Note was made on the study of bilateral complex ovarian cysts.  There is some associated free pelvic fluid.  The nature of the complex cysts suggests hemorrhagic cysts and potentially an underlying endometrioma.  No other obvious endometrial implants are identified in the pelvis.  I made Tina Foster aware that some of her symptoms of pain may be related to endometriosis, especially during her menstrual cycles if she truly has endometriosis.  The MRI is not diagnostic for endometriosis.  I suggested that she make an appointment with Dr. Charlesetta Garibaldi to discuss possible work-up and follow-up of these findings that might be consistent with additional endometriosis.  There are no contraindications to fibroid embolization based on fibroid morphology by MRI.  Tina Foster would like to proceed with scheduling of fibroid embolization and we will begin the authorization and scheduling process.  In the meantime, I recommended she speak with Dr. Charlesetta Garibaldi regarding the possibility of underlying endometriosis.   Venetia Night. Kathlene Cote, M.D Pager:  [phone]

## 2021-03-28 ENCOUNTER — Other Ambulatory Visit (HOSPITAL_COMMUNITY): Payer: Self-pay | Admitting: Interventional Radiology

## 2021-03-28 DIAGNOSIS — D259 Leiomyoma of uterus, unspecified: Secondary | ICD-10-CM

## 2021-05-21 ENCOUNTER — Other Ambulatory Visit (HOSPITAL_COMMUNITY): Payer: Self-pay | Admitting: Interventional Radiology

## 2021-05-22 LAB — SARS CORONAVIRUS 2 (TAT 6-24 HRS): SARS Coronavirus 2: NEGATIVE

## 2021-05-23 ENCOUNTER — Other Ambulatory Visit: Payer: Self-pay | Admitting: Physician Assistant

## 2021-05-23 MED ORDER — KETOROLAC TROMETHAMINE 30 MG/ML IJ SOLN: 30.0000 mg | Freq: Once | INTRAMUSCULAR | Status: DC

## 2021-05-24 ENCOUNTER — Encounter (HOSPITAL_COMMUNITY): Payer: Self-pay

## 2021-05-24 ENCOUNTER — Other Ambulatory Visit: Payer: Self-pay

## 2021-05-24 ENCOUNTER — Observation Stay (HOSPITAL_COMMUNITY)
Admission: RE | Admit: 2021-05-24 | Discharge: 2021-05-25 | Disposition: A | Discharge status: Home / Self Care | Payer: BC Managed Care – PPO | Source: Ambulatory Visit | Attending: Interventional Radiology | Admitting: Interventional Radiology

## 2021-05-24 ENCOUNTER — Ambulatory Visit (HOSPITAL_COMMUNITY)
Admission: RE | Admit: 2021-05-24 | Discharge: 2021-05-24 | Disposition: A | Discharge status: Home / Self Care | Payer: BC Managed Care – PPO | Source: Ambulatory Visit | Attending: Interventional Radiology | Admitting: Interventional Radiology

## 2021-05-24 DIAGNOSIS — D259 Leiomyoma of uterus, unspecified: Principal | ICD-10-CM | POA: Insufficient documentation

## 2021-05-24 HISTORY — PX: IR ANGIOGRAM PELVIS SELECTIVE OR SUPRASELECTIVE: IMG661

## 2021-05-24 HISTORY — PX: IR EMBO TUMOR ORGAN ISCHEMIA INFARCT INC GUIDE ROADMAPPING: IMG5449

## 2021-05-24 HISTORY — PX: IR US GUIDE VASC ACCESS RIGHT: IMG2390

## 2021-05-24 HISTORY — PX: IR ANGIOGRAM SELECTIVE EACH ADDITIONAL VESSEL: IMG667

## 2021-05-24 LAB — HCG, SERUM, QUALITATIVE: Preg, Serum: NEGATIVE

## 2021-05-24 LAB — CBC
HCT: 39.7 % (ref 36.0–46.0)
Hemoglobin: 13 g/dL (ref 12.0–15.0)
MCH: 29.1 pg (ref 26.0–34.0)
MCHC: 32.7 g/dL (ref 30.0–36.0)
MCV: 88.8 fL (ref 80.0–100.0)
Platelets: 269 10*3/uL (ref 150–400)
RBC: 4.47 MIL/uL (ref 3.87–5.11)
RDW: 12.4 % (ref 11.5–15.5)
WBC: 7.9 10*3/uL (ref 4.0–10.5)
nRBC: 0 % (ref 0.0–0.2)

## 2021-05-24 LAB — PROTIME-INR
INR: 1 (ref 0.8–1.2)
Prothrombin Time: 13 seconds (ref 11.4–15.2)

## 2021-05-24 LAB — BASIC METABOLIC PANEL
Anion gap: 7 (ref 5–15)
BUN: 12 mg/dL (ref 6–20)
CO2: 24 mmol/L (ref 22–32)
Calcium: 8.7 mg/dL — ABNORMAL LOW (ref 8.9–10.3)
Chloride: 105 mmol/L (ref 98–111)
Creatinine, Ser: 0.69 mg/dL (ref 0.44–1.00)
GFR, Estimated: >60 mL/min (ref >60)
Glucose, Bld: 91 mg/dL (ref 70–99)
Potassium: 4.8 mmol/L (ref 3.5–5.1)
Sodium: 136 mmol/L (ref 135–145)

## 2021-05-24 MED ORDER — NALOXONE HCL 0.4 MG/ML IJ SOLN: 0.4000 mg | INTRAMUSCULAR | Status: DC | PRN

## 2021-05-24 MED ORDER — HYDRALAZINE HCL 20 MG/ML IJ SOLN
INTRAMUSCULAR | Status: DC | PRN
  Administered 2021-05-24: 10 mg via INTRAVENOUS

## 2021-05-24 MED ORDER — CEFAZOLIN SODIUM-DEXTROSE 2-4 GM/100ML-% IV SOLN: 2.0000 g | Freq: Once | INTRAVENOUS | Status: AC

## 2021-05-24 MED ORDER — MIDAZOLAM HCL 2 MG/2ML IJ SOLN
INTRAMUSCULAR | Status: AC
  Filled 2021-05-24: qty 4

## 2021-05-24 MED ORDER — ONDANSETRON HCL 4 MG/2ML IJ SOLN
INTRAMUSCULAR | Status: AC
  Filled 2021-05-24: qty 2

## 2021-05-24 MED ORDER — DOCUSATE SODIUM 100 MG PO CAPS: 100.0000 mg | ORAL_CAPSULE | Freq: Two times a day (BID) | ORAL | Status: DC | PRN

## 2021-05-24 MED ORDER — ONDANSETRON HCL 4 MG/2ML IJ SOLN
INTRAMUSCULAR | Status: DC | PRN
  Administered 2021-05-24: 4 mg via INTRAVENOUS

## 2021-05-24 MED ORDER — MIDAZOLAM HCL 2 MG/2ML IJ SOLN
INTRAMUSCULAR | Status: DC | PRN
  Administered 2021-05-24 (×4): 1 mg via INTRAVENOUS

## 2021-05-24 MED ORDER — IOHEXOL 300 MG/ML  SOLN
100.0000 mL | Freq: Once | INTRAMUSCULAR | Status: AC | PRN
  Administered 2021-05-24: 50 mL via INTRA_ARTERIAL

## 2021-05-24 MED ORDER — OXYCODONE-ACETAMINOPHEN 5-325 MG PO TABS: 1.0000 | ORAL_TABLET | ORAL | Status: DC | PRN

## 2021-05-24 MED ORDER — CEFAZOLIN SODIUM-DEXTROSE 2-4 GM/100ML-% IV SOLN
INTRAVENOUS | Status: AC
  Administered 2021-05-24: 2 g via INTRAVENOUS
  Filled 2021-05-24: qty 100

## 2021-05-24 MED ORDER — SODIUM CHLORIDE 0.9 % IV SOLN: INTRAVENOUS | Status: DC

## 2021-05-24 MED ORDER — HYDRALAZINE HCL 20 MG/ML IJ SOLN
INTRAMUSCULAR | Status: AC
  Filled 2021-05-24: qty 1

## 2021-05-24 MED ORDER — IBUPROFEN 400 MG PO TABS
600.0000 mg | ORAL_TABLET | Freq: Four times a day (QID) | ORAL | Status: DC
  Administered 2021-05-25 (×2): 600 mg via ORAL
  Filled 2021-05-24 (×2): qty 1

## 2021-05-24 MED ORDER — HYDROCODONE-ACETAMINOPHEN 5-325 MG PO TABS: 1.0000 | ORAL_TABLET | ORAL | Status: DC | PRN

## 2021-05-24 MED ORDER — KETOROLAC TROMETHAMINE 30 MG/ML IJ SOLN
INTRAMUSCULAR | Status: DC | PRN
  Administered 2021-05-24: 30 mg via INTRAVENOUS

## 2021-05-24 MED ORDER — FENTANYL CITRATE (PF) 100 MCG/2ML IJ SOLN
INTRAMUSCULAR | Status: DC | PRN
  Administered 2021-05-24 (×4): 50 ug via INTRAVENOUS

## 2021-05-24 MED ORDER — LIDOCAINE HCL 1 % IJ SOLN
INTRAMUSCULAR | Status: AC
  Filled 2021-05-24: qty 20

## 2021-05-24 MED ORDER — DIPHENHYDRAMINE HCL 50 MG/ML IJ SOLN: 12.5000 mg | Freq: Four times a day (QID) | INTRAMUSCULAR | Status: DC | PRN

## 2021-05-24 MED ORDER — ONDANSETRON HCL 4 MG/2ML IJ SOLN
4.0000 mg | Freq: Four times a day (QID) | INTRAMUSCULAR | Status: DC | PRN
  Administered 2021-05-24: 4 mg via INTRAVENOUS
  Filled 2021-05-24: qty 2

## 2021-05-24 MED ORDER — IOHEXOL 300 MG/ML  SOLN
100.0000 mL | Freq: Once | INTRAMUSCULAR | Status: AC | PRN
  Administered 2021-05-24: 70 mL via INTRA_ARTERIAL

## 2021-05-24 MED ORDER — KETOROLAC TROMETHAMINE 30 MG/ML IJ SOLN
INTRAMUSCULAR | Status: AC
  Filled 2021-05-24: qty 1

## 2021-05-24 MED ORDER — LIDOCAINE HCL (PF) 1 % IJ SOLN
INTRAMUSCULAR | Status: DC | PRN
  Administered 2021-05-24: 5 mL

## 2021-05-24 MED ORDER — DIPHENHYDRAMINE HCL 12.5 MG/5ML PO ELIX
12.5000 mg | ORAL_SOLUTION | Freq: Four times a day (QID) | ORAL | Status: DC | PRN
  Filled 2021-05-24: qty 5

## 2021-05-24 MED ORDER — SODIUM CHLORIDE 0.9% FLUSH: 9.0000 mL | INTRAVENOUS | Status: DC | PRN

## 2021-05-24 MED ORDER — FENTANYL CITRATE (PF) 100 MCG/2ML IJ SOLN
INTRAMUSCULAR | Status: AC
  Filled 2021-05-24: qty 4

## 2021-05-24 MED ORDER — HYDROMORPHONE 1 MG/ML IV SOLN
INTRAVENOUS | Status: AC
  Administered 2021-05-24: 30 mg via INTRAVENOUS
  Administered 2021-05-24: 4.8 mg via INTRAVENOUS
  Filled 2021-05-24 (×11): qty 30

## 2021-05-24 MED ORDER — SERTRALINE HCL 50 MG PO TABS
75.0000 mg | ORAL_TABLET | Freq: Every day | ORAL | Status: DC
  Administered 2021-05-25: 75 mg via ORAL
  Filled 2021-05-24: qty 1

## 2021-05-24 NOTE — Plan of Care (Signed)
PT arrived to room from IR,  bedside report given.Patient complains of nausea and pain. PCA encouraged for pain control,will continue to monitor.

## 2021-05-24 NOTE — H&P (Signed)
Chief Complaint: Patient was seen in consultation today for bilateral uterine artery angiogram with embolization at the request of Yamagata,Glenn/ Dillard, Naima.  Referring Physician(s): Yamagata,Glenn/Dillard, Naima.  Supervising Physician: Aletta Edouard  Patient Status: [facility] - Out-pt  History of Present Illness: Tina Foster is a 35 y.o. female with symptomatic uterine fibroids.  Patient had a consultation visit with Dr. Kathlene Cote on 03/01/21 and underwent MRI on 03/10/21. She was deemed a good candidate for the bilateral uterine artery angiogram with embolization.   Patient presents to [facility] IR today for the procedure.   Patient laying in bed, not in acute distress.  Denise headache, fever, chills, shortness of breath, cough, chest pain, abdominal pain, nausea ,vomiting, and bleeding. LMP beginning of December.    History reviewed. No pertinent past medical history.  Past Surgical History:  Procedure Laterality Date   IR RADIOLOGIST EVAL & MGMT  03/01/2021   IR RADIOLOGIST EVAL & MGMT  03/22/2021    Allergies: Eggs or egg-derived products and Fluzone  Medications: Prior to Admission medications   Medication Sig Start Date End Date Taking? Authorizing Provider  sertraline (ZOLOFT) 50 MG tablet Take 75 mg by mouth daily.   Yes [provider]     History reviewed. No pertinent family history.  Social History   Socioeconomic History   Marital status: Single    Spouse name: Not on file   Number of children: Not on file   Years of education: Not on file   Highest education level: Not on file  Occupational History   Not on file  Tobacco Use   Smoking status: Not on file   Smokeless tobacco: Not on file  Substance and Sexual Activity   Alcohol use: Not on file   Drug use: Not on file   Sexual activity: Not on file  Other Topics Concern   Not on file  Social History Narrative   Not on file   Social Determinants of Health   Financial Resource  Strain: Not on file  Food Insecurity: Not on file  Transportation Needs: Not on file  Physical Activity: Not on file  Stress: Not on file  Social Connections: Not on file     Review of Systems: A 12 point ROS discussed and pertinent positives are indicated in the HPI above.  All other systems are negative.  Vital Signs: BP (!) 155/93    Pulse 68    Temp 99.6 F (37.6 C) (Oral)    Resp 16    LMP 05/10/2021 (Exact Date) Comment: serum preg.pending 05/24/21   SpO2 96%    Physical Exam Vitals and nursing note reviewed.  Constitutional:      General: Patient is not in acute distress.    Appearance: Normal appearance. Patient is not ill-appearing.  HENT:     Head: Normocephalic and atraumatic.     Mouth/Throat:     Mouth: Mucous membranes are moist.     Pharynx: Oropharynx is clear.  Cardiovascular:     Rate and Rhythm: Normal rate and regular rhythm.     Pulses: Normal pulses.     Heart sounds: Normal heart sounds.  Pulmonary:     Effort: Pulmonary effort is normal.     Breath sounds: Normal breath sounds.  Abdominal:     General: Abdomen is flat. Bowel sounds are normal.     Palpations: Abdomen is soft.  Musculoskeletal:     Cervical back: Neck supple.  Skin:    General: Skin is warm and  dry.     Coloration: Skin is not jaundiced or pale.  Neurological:     Mental Status: Patient is alert and oriented to person, place, and time.  Psychiatric:        Mood and Affect: Mood normal.        Behavior: Behavior normal.        Judgment: Judgment normal.    MD Evaluation Airway: WNL Heart: WNL Abdomen: WNL Chest/ Lungs: WNL ASA  Classification: 2 Mallampati/Airway Score: One  Imaging: No results found.  Labs:  CBC: Recent Labs    05/24/21 0750  WBC 7.9  HGB 13.0  HCT 39.7  PLT 269    COAGS: Recent Labs    05/24/21 0750  INR 1.0    BMP: Recent Labs    05/24/21 0750  NA 136  K 4.8  CL 105  CO2 24  GLUCOSE 91  BUN 12  CALCIUM 8.7*  CREATININE  0.69  GFRNONAA >60    LIVER FUNCTION TESTS: No results for input(s): BILITOT, AST, ALT, ALKPHOS, PROT, ALBUMIN in the last 8760 hours.  TUMOR MARKERS: No results for input(s): AFPTM, CEA, CA199, CHROMGRNA in the last 8760 hours.  Assessment and Plan: 35 y.o. female with  symptomatic uterine fibroids who was deemed a good candidate for bilateral uterine artery embolization.   Patient presents to [facility] IR for the procedure with Dr. Kathlene Cote today.  NPO since MN VSS CBC all w/in normal limits  INR 1.0 hCG negative  BMP with normal RF  Not on AC/AP  Risks and benefits of procedure were discussed with the patient including, but not limited to bleeding, infection, vascular injury or contrast induced renal failure.   This interventional procedure involves the use of X-rays and because of the nature of the planned procedure, it is possible that we will have prolonged use of X-ray fluoroscopy.   Potential radiation risks to you include (but are not limited to) the following: - A slightly elevated risk for cancer several years later in life. This risk is typically less than 0.5% percent. This risk is low in comparison to the normal incidence of human cancer, which is 33% for women and 50% for men according to the [place].  - Radiation induced injury can include skin redness, resembling a rash, tissue breakdown / ulcers and hair loss (which can be temporary or permanent).    The likelihood of either of these occurring depends on the difficulty of the procedure and whether you are sensitive to radiation due to previous procedures, disease, or genetic conditions.    IF your procedure requires a prolonged use of radiation, you will be notified and given written instructions for further action.  It is your responsibility to monitor the irradiated area for the 2 weeks following the procedure and to notify your physician if you are concerned that you have suffered a radiation induced  injury.     All of the patient's questions were answered, patient is agreeable to proceed.   Consent signed and in chart.    Thank you for this interesting consult.  I greatly enjoyed meeting Tina Foster and look forward to participating in their care.  A copy of this report was sent to the requesting provider on this date.  Electronically Signed: Tera Mater, PA-C 05/24/2021, 9:09 AM   I spent a total of    25 Minutes in face to face in clinical consultation, greater than 50% of which was counseling/coordinating care for bilateral [place].  This chart was dictated using voice recognition software.  Despite best efforts to proofread,  errors can occur which can change the documentation meaning.

## 2021-05-24 NOTE — Procedures (Signed)
Interventional Radiology Procedure Note  Procedure: Uterine fibroid embolization  Access: Right CFA  Complications: None  Estimated Blood Loss: < 10 mL  Findings: Bilateral uterine artery embolization. Left: 2 vials 500-700 and 2 vials 700-900 micron Embosphere particles. Right: 2 vials 500-700 and 1 vial 700-900 micron Embosphere particles.  Closure: ExoSeal  Plan: Overnight observation.  Venetia Night. Kathlene Cote, M.D Pager:  [phone]

## 2021-05-24 NOTE — Progress Notes (Signed)
Patient is s/p bilat [place] with Dr. Kathlene Cote today.   Patient seen at bedside.  Laying in bed, NAD. Mother at bedside.  States that cramp is severe but manageable with PCA.  Encourage to use PCA tonight, and will transition to oral pain meds starting 10 am tomorrow.  Patient verbalized understanding.   Positive dressing on right CFA puncture site. Site is unremarkable with no erythema, edema, tenderness, bleeding or drainage. Minimal amount of old, dry blood noted on the dressing. Dressing otherwise clean, dry, and intact.  DP 2+ bilaterally  Will see patient tomorrow and d/c if stable.  Please call IR for questions and concerns.    Armando Gang Tawni Melkonian PA-C 05/24/2021 3:52 PM

## 2021-05-25 DIAGNOSIS — D259 Leiomyoma of uterus, unspecified: Secondary | ICD-10-CM | POA: Diagnosis not present

## 2021-05-25 MED ORDER — DOCUSATE SODIUM 100 MG PO CAPS
100.0000 mg | ORAL_CAPSULE | Freq: Two times a day (BID) | ORAL | 0 refills | Status: DC | PRN
Start: 2021-05-25 — End: 2021-09-11

## 2021-05-25 MED ORDER — IBUPROFEN 600 MG PO TABS
600.0000 mg | ORAL_TABLET | Freq: Four times a day (QID) | ORAL | 0 refills | Status: AC
Start: 2021-05-25 — End: 2021-05-30

## 2021-05-25 MED ORDER — HYDROCODONE-ACETAMINOPHEN 5-325 MG PO TABS: 1.0000 | ORAL_TABLET | ORAL | 0 refills | Status: AC | PRN

## 2021-05-25 MED ORDER — HYDROCODONE-ACETAMINOPHEN 5-325 MG PO TABS
1.0000 | ORAL_TABLET | ORAL | Status: DC | PRN
  Administered 2021-05-25: 1 via ORAL
  Filled 2021-05-25: qty 1

## 2021-05-25 MED ORDER — ONDANSETRON HCL 4 MG PO TABS: 8.0000 mg | ORAL_TABLET | Freq: Three times a day (TID) | ORAL | Status: DC | PRN

## 2021-05-25 MED ORDER — ONDANSETRON HCL 8 MG PO TABS: 4.0000 mg | ORAL_TABLET | ORAL | 0 refills | Status: AC | PRN

## 2021-05-25 NOTE — Progress Notes (Signed)
°  Transition of Care [facility]) Screening Note   Patient Details  Name: Destenee Guerry Date of Birth: 04/25/1986   Transition of Care [facility]) CM/SW Contact:    Dessa Phi, RN Phone Number: 05/25/2021, 9:21 AM    Transition of Care Department [facility]) has reviewed patient and no TOC needs have been identified at this time. We will continue to monitor patient advancement through interdisciplinary progression rounds. If new patient transition needs arise, please place a TOC consult.

## 2021-05-25 NOTE — Discharge Instructions (Signed)
Uterine Artery Embolization, Care After  This sheet gives you information about how to care for yourself after your procedure. Your health care provider may also give you more specific instructions. If you have problems or questions, contact your health care provider.  What can I expect after the procedure? After your procedure, it is common to have pain and discomfort in the abdomen. You will be given prescription pain medicines to control this if the pain is severe.  Take Ibuprofen every 6 hours Take opioids as needed for severe pain Take Zofran up to three times a day as needed/ if become nauseous after taking opioids Take Colace up to two times a day as needed/ if become constipated after taking opioids  Follow these instructions at home: Puncture site care Follow instructions from your health care provider about how to take care of your incision. Make sure you:  - Ok to shower 24 hours following procedure. No further dressing changes needed - ensure area remains clean and dry until fully healed.  - No submerging (swimming, bathing) for 7 days post-procedure. Check your puncture site area every day for signs of infection. Check for: - Redness, swelling, or pain. - Fluid or blood. - Warmth. - Pus or a bad smell. Activity Rest as often as needing during the first few days of recovery.  General instructions To prevent or treat constipation while you are taking prescription pain medicine, your health care provider may recommend that you: - Drink enough fluid to keep your urine clear or pale yellow. - Take over-the-counter or prescription medicines. - Eat foods that are high in fiber, such as fresh fruits and vegetables, whole grains, and beans. - Limit foods that are high in fat and processed sugars, such as fried and sweet foods. - Do not use any products that contain nicotine or tobacco, such as cigarettes and e-cigarettes. If you need help quitting, ask your health care provider. -  Keep all follow-up visits as told by your health care provider. This is important.  3-4 week televisit with the doctor who performed the procedure. Our office will call you to set up the appointment.   Contact a health care provider if: You cannot pass gas. You are unable to have a bowel movement within 3 days. You have a skin rash.  Get help right away if: You have a fever. You have severe or lasting pain in your abdomen, shoulder, or back. You have trouble swallowing or breathing. You have severe weakness or dizziness. You have chest pain or shortness of breath.   This information is not intended to replace advice given to you by your health care provider. Make sure you discuss any questions you have with your health care provider.  

## 2021-05-25 NOTE — Progress Notes (Signed)
Discharge instructions discussed with patient and family, verbalized agreement and understanding 

## 2021-05-25 NOTE — Discharge Summary (Signed)
Patient ID: Tina Foster MRN: [id number] DOB/AGE: [id number] y.o.  Admit date: 05/24/2021 Discharge date: 05/25/2021  Supervising Physician: Juliet Rude  Patient Status: [facility] - In-pt  Admission Diagnoses: uterine fibroids   Discharge Diagnoses:  Active Problems:   * No active hospital problems. *   Discharged Condition: good  Hospital Course:  Patient presented to [facility] IR for an bilateral uterine artery embolization with Dr. Kathlene Cote yesterday. Procedure occurred without major complications and patient was transferred to floor in stable condition (VSS, right CFA puncture site stable) for overnight observation. No major events occurred overnight.   Patient awake and alert  no complaints at this time. Mother at bedside. Foley catheter removed this AM without difficulty. Reports moderate lower abdominal pain/cramping, much improved compared to last night. Patient is controlled with po ibuprofen and Norco. Patient denies right groin pain or hematuria. Right groin puncture site stable. Plan to discharge home today and follow-up with Dr. Kathlene Cote for televisit 3-4 weeks after discharge.  Patient was instructed to take ibuprofen 600 mg every 4 hours for 5 days, and may take Norco as needed for breakthrough pain.  Patient was encourage to take Colace and Zofran as needed to prevent constipation/nausea.  Patient verbalized understanding.    Consults: none  Significant Diagnostic Studies: none   Treatments: bilateral uterine artery embolization   Discharge Exam: Blood pressure 135/81, pulse 71, temperature 98.7 F (37.1 C), temperature source Oral, resp. rate 18, height 5\' 5"  (1.651 m), weight (!) 349 lb 3.3 oz (158.4 kg), last menstrual period 05/10/2021, SpO2 96 %.  Physical Exam Vitals and nursing note reviewed.  Constitutional:      General: Patient is not in acute distress.    Appearance: Normal appearance. Patient is not ill-appearing.  HENT:     Head:  Normocephalic and atraumatic.     Mouth/Throat:     Mouth: Mucous membranes are moist.     Pharynx: Oropharynx is clear.  Cardiovascular:     Rate and Rhythm: Normal rate and regular rhythm.     Pulses: Normal pulses.     Heart sounds: Normal heart sounds.  Pulmonary:     Effort: Pulmonary effort is normal.     Breath sounds: Normal breath sounds.  Abdominal:     General: Abdomen is flat. Bowel sounds are normal.     Palpations: Abdomen is soft.  Musculoskeletal:     Cervical back: Neck supple.  DP 2+ bilaterally  Skin:    General: Skin is warm and dry.     Coloration: Skin is not jaundiced or pale.  Positive dressing on R CFA puncture site. Site is unremarkable with no erythema, edema, tenderness, bleeding or drainage. Minimal amount of old, dry blood noted on the dressing. Dressing otherwise clean, dry, and intact.  Neurological:     Mental Status: Patient is alert and oriented to person, place, and time.  Psychiatric:        Mood and Affect: Mood normal.        Behavior: Behavior normal.        Judgment: Judgment normal.    Disposition: Discharge disposition: 01-Home or Self Care       Discharge Instructions     Call MD for:   Complete by: As directed    Call MD for:  difficulty breathing, headache or visual disturbances   Complete by: As directed    Call MD for:  extreme fatigue   Complete by: As directed    Call  MD for:  hives   Complete by: As directed    Call MD for:  persistant dizziness or light-headedness   Complete by: As directed    Call MD for:  persistant nausea and vomiting   Complete by: As directed    Call MD for:  redness, tenderness, or signs of infection (pain, swelling, redness, odor or green/yellow discharge around incision site)   Complete by: As directed    Call MD for:  severe uncontrolled pain   Complete by: As directed    Call MD for:  temperature >100.4   Complete by: As directed    Diet - low sodium heart healthy   Complete by: As  directed    Discharge wound care:   Complete by: As directed    No further dressing change needed, may put a Band-Aid until fully heals. Keep the right groin site clean and dry, no sitting under the water for 7 days.   Increase activity slowly   Complete by: As directed    No stooping, bending, lifting more than 10 pounds for 1 week.      Allergies as of 05/25/2021       Reactions   Eggs Or Egg-derived Products    REACTION: Positive skin test   Fluzone    REACTION: Positive skin test to allergy        Medication List     TAKE these medications    docusate sodium 100 MG capsule Commonly known as: COLACE Take 1 capsule (100 mg total) by mouth 2 (two) times daily as needed for mild constipation or moderate constipation.   HYDROcodone-acetaminophen 5-325 MG tablet Commonly known as: NORCO/VICODIN Take 1 tablet by mouth every 4 (four) hours as needed for up to 5 days for moderate pain or severe pain.   ibuprofen 600 MG tablet Commonly known as: ADVIL Take 1 tablet (600 mg total) by mouth 4 (four) times daily for 5 days.   ondansetron 8 MG tablet Commonly known as: ZOFRAN Take 0.5 tablets (4 mg total) by mouth every 4 (four) hours as needed for up to 5 days for nausea or vomiting.   sertraline 50 MG tablet Commonly known as: ZOLOFT Take 75 mg by mouth daily.               Discharge Care Instructions  (From admission, onward)           Start     Ordered   05/25/21 0000  Discharge wound care:       Comments: No further dressing change needed, may put a Band-Aid until fully heals. Keep the right groin site clean and dry, no sitting under the water for 7 days.   05/25/21 1305            Follow-up Information     Aletta Edouard, MD Follow up.   Specialties: Interventional Radiology, Radiology Why: Follow up televisit with Dr. Kathlene Cote 3-4 weeks from 12/15. Our schedulers will call you to set up the appointment. Contact information: [address]  [place] [address] [phone]                  Electronically Signed: Tera Mater, PA-C 05/25/2021, 1:05 PM   I have spent Less Than 30 Minutes discharging Tina Foster.

## 2021-06-26 ENCOUNTER — Other Ambulatory Visit: Payer: Self-pay

## 2021-06-26 ENCOUNTER — Ambulatory Visit
Admission: RE | Admit: 2021-06-26 | Discharge: 2021-06-26 | Disposition: A | Discharge status: Home / Self Care | Payer: BC Managed Care – PPO | Source: Ambulatory Visit | Attending: Student | Admitting: Student

## 2021-06-26 ENCOUNTER — Encounter: Payer: Self-pay | Admitting: *Deleted

## 2021-06-26 DIAGNOSIS — D259 Leiomyoma of uterus, unspecified: Secondary | ICD-10-CM

## 2021-06-26 HISTORY — PX: IR RADIOLOGIST EVAL & MGMT: IMG5224

## 2021-06-26 NOTE — Progress Notes (Signed)
Chief Complaint: Patient was consulted remotely today (TeleHealth) for follow-up after uterine fibroid embolization.  History of Present Illness: Tina Foster is a 36 y.o. female status post uterine fibroid embolization on 05/24/2021.  Following the procedure, she had some cramping at home for approximately 3 to 4 days which then resolved.  She developed COVID on 05/29/2021 and has fully recovered except for some hearing loss in both ears for which she is seeing ENT.  After the procedure she does subjectively feel less pelvic fullness and can bend at the waist with less effort.  She did have some spotting for a week and began a menstrual cycle around 06/23/2021.  She feels as though the bleeding this cycle has been steady but not heavier than her usual periods and she has not had any clots as she previously had.  No past medical history on file.  Past Surgical History:  Procedure Laterality Date   IR ANGIOGRAM PELVIS SELECTIVE OR SUPRASELECTIVE  05/24/2021   IR ANGIOGRAM PELVIS SELECTIVE OR SUPRASELECTIVE  05/24/2021   IR ANGIOGRAM SELECTIVE EACH ADDITIONAL VESSEL  05/24/2021   IR ANGIOGRAM SELECTIVE EACH ADDITIONAL VESSEL  05/24/2021   IR EMBO TUMOR ORGAN ISCHEMIA INFARCT INC GUIDE ROADMAPPING  05/24/2021   IR RADIOLOGIST EVAL & MGMT  03/01/2021   IR RADIOLOGIST EVAL & MGMT  03/22/2021   IR US GUIDE VASC ACCESS RIGHT  05/24/2021    Allergies: Eggs or egg-derived products and Fluzone  Medications: Prior to Admission medications   Medication Sig Start Date End Date Taking? Authorizing Provider  docusate sodium (COLACE) 100 MG capsule Take 1 capsule (100 mg total) by mouth 2 (two) times daily as needed for mild constipation or moderate constipation. 05/25/21   Han, Aimee H, PA-C  sertraline (ZOLOFT) 50 MG tablet Take 75 mg by mouth daily.    [provider]     No family history on file.  Social History   Socioeconomic History   Marital status: Single    Spouse  name: Not on file   Number of children: Not on file   Years of education: Not on file   Highest education level: Not on file  Occupational History   Not on file  Tobacco Use   Smoking status: Not on file   Smokeless tobacco: Not on file  Substance and Sexual Activity   Alcohol use: Not on file   Drug use: Not on file   Sexual activity: Not on file  Other Topics Concern   Not on file  Social History Narrative   Not on file   Social Determinants of Health   Financial Resource Strain: Not on file  Food Insecurity: Not on file  Transportation Needs: Not on file  Physical Activity: Not on file  Stress: Not on file  Social Connections: Not on file    Review of Systems  Constitutional: Negative.   HENT:  Positive for hearing loss.   Respiratory: Negative.    Cardiovascular: Negative.   Gastrointestinal: Negative.   Genitourinary: Negative.   Musculoskeletal: Negative.   Neurological: Negative.    Review of Systems: A 12 point ROS discussed and pertinent positives are indicated in the HPI above.  All other systems are negative.  Physical Exam No direct physical exam was performed (except for noted visual exam findings with Video Visits).    Vital Signs: There were no vitals taken for this visit.  Imaging: No results found.  Labs:  CBC: Recent Labs    05/24/21 0750  WBC 7.9  HGB 13.0  HCT 39.7  PLT 269    COAGS: Recent Labs    05/24/21 0750  INR 1.0    BMP: Recent Labs    05/24/21 0750  NA 136  K 4.8  CL 105  CO2 24  GLUCOSE 91  BUN 12  CALCIUM 8.7*  CREATININE 0.69  GFRNONAA >60     Assessment and Plan:  Tina Foster is doing well after uterine fibroid embolization.  I did tell her that she may not notice much difference in her menstrual bleeding with this first cycle but hopefully will start to notice less menorrhagia with subsequent cycles.  She has already started to notice some improvement in bulk symptoms.  I recommended a follow-up  MRI of the pelvis in roughly September.  After the study, we will plan to meet in person to directly review imaging prior to, and following, embolization to assess degree of fibroid necrosis and volume reduction.  She has an upcoming appointment with Dr. Charlesetta Garibaldi for follow-up and to discuss the possibility of underlying endometriosis and possible endometrioma of the left ovary by prior MRI.  Electronically Signed: Azzie Roup 06/26/2021, 1:25 PM    I spent a total of  10 Minutes in remote  clinical consultation, greater than 50% of which was counseling/coordinating care post uterine fibroid embolization.    Visit type: Audio only (telephone). Audio (no video) only due to patient's lack of internet/smartphone capability. Alternative for in-person consultation at [facility], [address], [place], [place]. This visit type was conducted due to national recommendations for restrictions regarding the COVID-19 Pandemic (e.g. social distancing).  This format is felt to be most appropriate for this patient at this time.  All issues noted in this document were discussed and addressed.

## 2021-09-11 ENCOUNTER — Encounter
Admission: RE | Admit: 2021-09-11 | Discharge: 2021-09-11 | Disposition: A | Discharge status: Home / Self Care | Payer: BC Managed Care – PPO | Source: Ambulatory Visit | Attending: Otolaryngology | Admitting: Otolaryngology

## 2021-09-11 HISTORY — DX: Anxiety disorder, unspecified: F41.9

## 2021-09-11 HISTORY — DX: Unspecified asthma, uncomplicated: J45.909

## 2021-09-11 HISTORY — DX: Leiomyoma of uterus, unspecified: D25.9

## 2021-09-11 HISTORY — DX: Morbid (severe) obesity due to excess calories: E66.01

## 2021-09-11 HISTORY — DX: Aneurysm of heart: I25.3

## 2021-09-11 HISTORY — DX: Cardiac murmur, unspecified: R01.1

## 2021-09-11 HISTORY — DX: Renal agenesis, unilateral: Q60.0

## 2021-09-11 HISTORY — DX: Dermatitis, unspecified: L30.9

## 2021-09-11 NOTE — Patient Instructions (Addendum)
Your procedure is scheduled on: Wednesday, April 5 ?Report to the Registration Desk on the 1st floor of the [facility]. ?To find out your arrival time, please call [phone] between 1PM - 3PM on: Tuesday, April 4 ? ?REMEMBER: ?Instructions that are not followed completely may result in serious medical risk, up to and including death; or upon the discretion of your surgeon and anesthesiologist your surgery may need to be rescheduled. ? ?Do not eat food after midnight the night before surgery.  ?No gum chewing, lozengers or hard candies. ? ?You may however, drink CLEAR liquids up to 2 hours before you are scheduled to arrive for your surgery. Do not drink anything within 2 hours of your scheduled arrival time. ? ?Clear liquids include: ?- water  ?- apple juice without pulp ?- gatorade (not RED colors) ?- black coffee or tea (Do NOT add milk or creamers to the coffee or tea) ?Do NOT drink anything that is not on this list. ? ?DO NOT TAKE ANY MEDICATIONS THE MORNING OF SURGERY  ? ?One week prior to surgery: ?Stop Anti-inflammatories (NSAIDS) such as Advil, Aleve, Ibuprofen, Motrin, Naproxen, Naprosyn and Aspirin based products such as Excedrin, Goodys Powder, BC Powder. ?Stop ANY OVER THE COUNTER supplements until after surgery. ?You may however, continue to take Tylenol if needed for pain up until the day of surgery. ? ?No Alcohol for 24 hours before or after surgery. ? ?No Smoking including e-cigarettes for 24 hours prior to surgery.  ?No chewable tobacco products for at least 6 hours prior to surgery.  ?No nicotine patches on the day of surgery. ? ?Do not use any "recreational" drugs for at least a week prior to your surgery.  ?Please be advised that the combination of cocaine and anesthesia may have negative outcomes, up to and including death. ?If you test positive for cocaine, your surgery will be cancelled. ? ?On the morning of surgery brush your teeth with toothpaste and water, you may rinse your mouth  with mouthwash if you wish. ?Do not swallow any toothpaste or mouthwash. ? ?Do not wear jewelry, make-up, hairpins, clips or nail polish. ? ?Do not wear lotions, powders, or perfumes.  ? ?Do not shave body from the neck down 48 hours prior to surgery just in case you cut yourself which could leave a site for infection.  ? ?Contact lenses, hearing aids and dentures may not be worn into surgery. ? ?Do not bring valuables to the hospital. [facility] is not responsible for any missing/lost belongings or valuables.  ? ?Notify your doctor if there is any change in your medical condition (cold, fever, infection). ? ?Wear comfortable clothing (specific to your surgery type) to the hospital. ? ?After surgery, you can help prevent lung complications by doing breathing exercises.  ?Take deep breaths and cough every 1-2 hours. Your doctor may order a device called an Incentive Spirometer to help you take deep breaths. ? ?If you are being discharged the day of surgery, you will not be allowed to drive home. ?You will need a responsible adult (18 years or older) to drive you home and stay with you that night.  ? ?If you are taking public transportation, you will need to have a responsible adult (18 years or older) with you. ?Please confirm with your physician that it is acceptable to use public transportation.  ? ?Please call the [facility] Dept. at [phone] if you have any questions about these instructions. ? ?Surgery Visitation Policy: ? ?Patients undergoing  a surgery or procedure may have two family members or support persons with them as long as the person is not COVID-19 positive or experiencing its symptoms.  ?

## 2021-09-12 ENCOUNTER — Encounter
Admission: RE | Disposition: A | Discharge status: Home / Self Care | Payer: Self-pay | Source: Ambulatory Visit | Attending: Otolaryngology

## 2021-09-12 ENCOUNTER — Encounter: Payer: Self-pay | Admitting: Otolaryngology

## 2021-09-12 ENCOUNTER — Ambulatory Visit: Payer: BC Managed Care – PPO | Admitting: Urgent Care

## 2021-09-12 ENCOUNTER — Ambulatory Visit
Admission: RE | Admit: 2021-09-12 | Discharge: 2021-09-12 | Disposition: A | Discharge status: Home / Self Care | Payer: BC Managed Care – PPO | Source: Ambulatory Visit | Attending: Otolaryngology | Admitting: Otolaryngology

## 2021-09-12 DIAGNOSIS — H6990 Unspecified Eustachian tube disorder, unspecified ear: Secondary | ICD-10-CM | POA: Diagnosis present

## 2021-09-12 DIAGNOSIS — H6520 Chronic serous otitis media, unspecified ear: Secondary | ICD-10-CM | POA: Insufficient documentation

## 2021-09-12 HISTORY — PX: MYRINGOTOMY WITH TUBE PLACEMENT: SHX5663

## 2021-09-12 LAB — POCT PREGNANCY, URINE: Preg Test, Ur: NEGATIVE

## 2021-09-12 SURGERY — MYRINGOTOMY WITH TUBE PLACEMENT
Anesthesia: General | Site: Ear | Laterality: Bilateral

## 2021-09-12 MED ORDER — FENTANYL CITRATE (PF) 100 MCG/2ML IJ SOLN: 25.0000 ug | INTRAMUSCULAR | Status: DC | PRN

## 2021-09-12 MED ORDER — ORAL CARE MOUTH RINSE: 15.0000 mL | Freq: Once | OROMUCOSAL | Status: AC

## 2021-09-12 MED ORDER — LACTATED RINGERS IV SOLN: INTRAVENOUS | Status: DC

## 2021-09-12 MED ORDER — OXYCODONE HCL 5 MG/5ML PO SOLN: 5.0000 mg | Freq: Once | ORAL | Status: DC | PRN

## 2021-09-12 MED ORDER — MIDAZOLAM HCL 2 MG/2ML IJ SOLN
INTRAMUSCULAR | Status: AC
  Filled 2021-09-12: qty 2

## 2021-09-12 MED ORDER — MIDAZOLAM HCL 2 MG/2ML IJ SOLN
INTRAMUSCULAR | Status: DC | PRN
Start: 2021-09-12 — End: 2021-09-12
  Administered 2021-09-12 (×2): 1 mg via INTRAVENOUS
  Administered 2021-09-12: 2 mg via INTRAVENOUS

## 2021-09-12 MED ORDER — FAMOTIDINE 20 MG PO TABS
ORAL_TABLET | ORAL | Status: AC
  Administered 2021-09-12: 20 mg via ORAL
  Filled 2021-09-12: qty 1

## 2021-09-12 MED ORDER — LIDOCAINE HCL (PF) 2 % IJ SOLN
INTRAMUSCULAR | Status: AC
  Filled 2021-09-12: qty 5

## 2021-09-12 MED ORDER — CIPROFLOXACIN-DEXAMETHASONE 0.3-0.1 % OT SUSP
OTIC | Status: DC | PRN
  Administered 2021-09-12: 8 [drp] via OTIC

## 2021-09-12 MED ORDER — ONDANSETRON HCL 4 MG/2ML IJ SOLN: 4.0000 mg | Freq: Once | INTRAMUSCULAR | Status: DC | PRN

## 2021-09-12 MED ORDER — CIPROFLOXACIN-DEXAMETHASONE 0.3-0.1 % OT SUSP
OTIC | Status: AC
  Filled 2021-09-12: qty 7.5

## 2021-09-12 MED ORDER — DEXMEDETOMIDINE (PRECEDEX) IN NS 20 MCG/5ML (4 MCG/ML) IV SYRINGE
PREFILLED_SYRINGE | INTRAVENOUS | Status: DC | PRN
  Administered 2021-09-12: 4 ug via INTRAVENOUS
  Administered 2021-09-12 (×3): 8 ug via INTRAVENOUS

## 2021-09-12 MED ORDER — PROPOFOL 10 MG/ML IV BOLUS
INTRAVENOUS | Status: AC
  Filled 2021-09-12: qty 20

## 2021-09-12 MED ORDER — CHLORHEXIDINE GLUCONATE 0.12 % MT SOLN: 15.0000 mL | Freq: Once | OROMUCOSAL | Status: AC

## 2021-09-12 MED ORDER — ACETAMINOPHEN 10 MG/ML IV SOLN: 1000.0000 mg | Freq: Once | INTRAVENOUS | Status: DC | PRN

## 2021-09-12 MED ORDER — PROPOFOL 500 MG/50ML IV EMUL
INTRAVENOUS | Status: DC | PRN
  Administered 2021-09-12: 50 ug/kg/min via INTRAVENOUS

## 2021-09-12 MED ORDER — CHLORHEXIDINE GLUCONATE 0.12 % MT SOLN
OROMUCOSAL | Status: AC
  Administered 2021-09-12: 15 mL via OROMUCOSAL
  Filled 2021-09-12: qty 15

## 2021-09-12 MED ORDER — FENTANYL CITRATE (PF) 100 MCG/2ML IJ SOLN
INTRAMUSCULAR | Status: AC
  Filled 2021-09-12: qty 2

## 2021-09-12 MED ORDER — OXYCODONE HCL 5 MG PO TABS: 5.0000 mg | ORAL_TABLET | Freq: Once | ORAL | Status: DC | PRN

## 2021-09-12 MED ORDER — FENTANYL CITRATE (PF) 100 MCG/2ML IJ SOLN
INTRAMUSCULAR | Status: DC | PRN
  Administered 2021-09-12: 25 ug via INTRAVENOUS

## 2021-09-12 MED ORDER — FAMOTIDINE 20 MG PO TABS: 20.0000 mg | ORAL_TABLET | Freq: Once | ORAL | Status: AC

## 2021-09-12 SURGICAL SUPPLY — 9 items
BLADE MYRINGOTOMY SICKLE W/HDL (BLADE) ×2 IMPLANT
COTTON BALL STRL MEDIUM (GAUZE/BANDAGES/DRESSINGS) ×2 IMPLANT
GLOVE PROTEXIS LATEX SZ 7.5 (GLOVE) ×2 IMPLANT
GLOVE SURG LATEX 7.5 PF (GLOVE) ×1 IMPLANT
MANIFOLD NEPTUNE II (INSTRUMENTS) ×2 IMPLANT
TOWEL OR 17X26 4PK STRL BLUE (TOWEL DISPOSABLE) ×2 IMPLANT
TUBE EAR ARMSTRONG FL 1.14X4.5 (OTOLOGIC RELATED) ×4 IMPLANT
TUBING CONNECTING 10 (TUBING) ×2 IMPLANT
WATER STERILE IRR 500ML POUR (IV SOLUTION) ×2 IMPLANT

## 2021-09-12 NOTE — H&P (Signed)
H&P has been reviewed and patient reevaluated, no changes necessary. To be downloaded later.  

## 2021-09-12 NOTE — Anesthesia Preprocedure Evaluation (Addendum)
Anesthesia Evaluation  ?Patient identified by MRN, date of birth, ID band ?Patient awake ? ? ? ?Reviewed: ?Allergy & Precautions, NPO status , Patient's Chart, lab work & pertinent test results ? ?History of Anesthesia Complications ?Negative for: history of anesthetic complications ? ?Airway ?Mallampati: I ? ? ?Neck ROM: Full ? ? ? Dental ? ?(+) Chipped ?  ?Pulmonary ?asthma (childhood) ,  ?  ?Pulmonary exam normal ?breath sounds clear to auscultation ? ? ? ? ? ? Cardiovascular ?+ Valvular Problems/Murmurs (atrial septal aneurysm)  ?Rhythm:Regular Rate:Normal ?+ Systolic murmurs ?Echo 08/08/16:  ?NORMAL LEFT VENTRICULAR SYSTOLIC FUNCTION  ?NORMAL RIGHT VENTRICULAR SYSTOLIC FUNCTION  ?NEGATIVE SALINE CONTRAST STUDY ?  ?Neuro/Psych ?PSYCHIATRIC DISORDERS Anxiety negative neurological ROS ?   ? GI/Hepatic ?negative GI ROS,   ?Endo/Other  ?Class 3 obesity ? Renal/GU ?Renal disease (congenital solitary kidney)  ? ?  ?Musculoskeletal ? ? Abdominal ?  ?Peds ? Hematology ?negative hematology ROS ?(+)   ?Anesthesia Other Findings ? ? Reproductive/Obstetrics ?Uterine fibroids ? ?  ? ? ? ? ? ? ? ? ? ? ? ? ? ?  ?  ? ? ? ? ? ? ? ?Anesthesia Physical ?Anesthesia Plan ? ?ASA: 3 ? ?Anesthesia Plan: General  ? ?Post-op Pain Management:   ? ?Induction: Intravenous ? ?PONV Risk Score and Plan: 3 and Propofol infusion, TIVA, Treatment may vary due to age or medical condition and Ondansetron ? ?Airway Management Planned: Natural Airway ? ?Additional Equipment:  ? ?Intra-op Plan:  ? ?Post-operative Plan:  ? ?Informed Consent: I have reviewed the patients History and Physical, chart, labs and discussed the procedure including the risks, benefits and alternatives for the proposed anesthesia with the patient or authorized representative who has indicated his/her understanding and acceptance.  ? ? ? ? ? ?Plan Discussed with: CRNA ? ?Anesthesia Plan Comments: (LMA/GETA backup discussed.  Patient consented for  risks of anesthesia including but not limited to:  ?- adverse reactions to medications ?- damage to eyes, teeth, lips or other oral mucosa ?- nerve damage due to positioning  ?- sore throat or hoarseness ?- damage to heart, brain, nerves, lungs, other parts of body or loss of life ? ?Informed patient about role of CRNA in peri- and intra-operative care.  Patient voiced understanding.)  ? ? ? ? ? ? ?Anesthesia Quick Evaluation ? ?

## 2021-09-12 NOTE — Op Note (Signed)
09/12/2021 ? ?8:49 AM ? ? ? ?Tina Foster ? ?[id number] ? ? ?Pre-Op Dx: Eustachian tube dysfunction with chronic serous otitis media ? ?Post-op Dx: Same ? ?Proc:Bilateral myringotomy with tubes ? ?Surg: Huey Romans ? ?Anes:  General by mask ? ?EBL:  None ? ?Comp: None ? ?Findings: Clear to light yellow serous fluid filling the middle ear space on both sides.  Short Armstrong 5 tubes were placed on both sides. ? ?Procedure: With the patient in a comfortable supine position, general mask anesthesia was administered.  At an appropriate level, microscope and speculum were used to examine and clean the RIGHT ear canal.  The findings were as described above.  An anterior inferior radial myringotomy incision was sharply executed.  Middle ear contents were suctioned clear.  A PE tube was placed without difficulty.  Ciprodex otic solution was instilled into the external canal, and insufflated into the middle ear.  A cotton ball was placed at the external meatus. Hemostasis was observed.  This side was completed. ? ?After completing the RIGHT side, the LEFT side was done in identical fashion.   ? ?Following this  The patient was returned to anesthesia, awakened, and transferred to recovery in stable condition.  She tolerated the procedure very well. ? ?Dispo:  PACU to home ? ?Plan: Routine drop use and water precautions.  Recheck my office three weeks with audiogram. ? ? ?Tina Foster ?8:49 AM ?09/12/2021  ?

## 2021-09-12 NOTE — Anesthesia Postprocedure Evaluation (Signed)
Anesthesia Post Note ? ?Patient: Tina Foster ? ?Procedure(s) Performed: MYRINGOTOMY WITH TUBE PLACEMENT (Bilateral: Ear) ? ?Patient location during evaluation: PACU ?Anesthesia Type: General ?Level of consciousness: awake and alert, oriented and patient cooperative ?Pain management: pain level controlled ?Vital Signs Assessment: post-procedure vital signs reviewed and stable ?Respiratory status: spontaneous breathing, nonlabored ventilation and respiratory function stable ?Cardiovascular status: blood pressure returned to baseline and stable ?Postop Assessment: adequate PO intake ?Anesthetic complications: no ? ? ?No notable events documented. ? ? ?Last Vitals:  ?Vitals:  ? 09/12/21 0925 09/12/21 0937  ?BP: 117/75 137/80  ?Pulse: 73 67  ?Resp: 16 18  ?Temp: 36.9 ?C (!) 36.1 ?C  ?SpO2: 100% 100%  ?  ?Last Pain:  ?Vitals:  ? 09/12/21 0937  ?TempSrc: Temporal  ?PainSc: 0-No pain  ? ? ?  ?  ?  ?  ?  ?  ? ?Darrin Nipper ? ? ? ? ?

## 2021-09-12 NOTE — Transfer of Care (Signed)
Immediate Anesthesia Transfer of Care Note ? ?Patient: Tina Foster ? ?Procedure(s) Performed: MYRINGOTOMY WITH TUBE PLACEMENT (Bilateral) ? ?Patient Location: PACU ? ?Anesthesia Type:General ? ?Level of Consciousness: awake, drowsy and patient cooperative ? ?Airway & Oxygen Therapy: Patient Spontanous Breathing and Patient connected to nasal cannula oxygen ? ?Post-op Assessment: Report given to RN and Post -op Vital signs reviewed and stable ? ?Post vital signs: Reviewed and stable ? ?Last Vitals:  ?Vitals Value Taken Time  ?BP    ?Temp    ?Pulse 77 09/12/21 0855  ?Resp 18 09/12/21 0855  ?SpO2 99 % 09/12/21 0855  ?Vitals shown include unvalidated device data. ? ?Last Pain:  ?Vitals:  ? 09/12/21 0716  ?TempSrc: Temporal  ?PainSc: 0-No pain  ?   ? ?  ? ?Complications: No notable events documented. ?

## 2021-09-12 NOTE — Discharge Instructions (Signed)

## 2022-02-01 ENCOUNTER — Other Ambulatory Visit: Payer: Self-pay | Admitting: Interventional Radiology

## 2022-02-01 DIAGNOSIS — D259 Leiomyoma of uterus, unspecified: Secondary | ICD-10-CM

## 2022-02-27 ENCOUNTER — Ambulatory Visit
Admission: RE | Admit: 2022-02-27 | Discharge: 2022-02-27 | Disposition: A | Discharge status: Home / Self Care | Payer: BC Managed Care – PPO | Source: Ambulatory Visit | Attending: Interventional Radiology | Admitting: Interventional Radiology

## 2022-02-27 DIAGNOSIS — D259 Leiomyoma of uterus, unspecified: Secondary | ICD-10-CM | POA: Diagnosis present

## 2022-02-27 MED ORDER — GADOPICLENOL 0.5 MMOL/ML IV SOLN
10.0000 mL | Freq: Once | INTRAVENOUS | Status: AC | PRN
  Administered 2022-02-27: 10 mL via INTRAVENOUS

## 2022-03-01 ENCOUNTER — Ambulatory Visit
Admission: RE | Admit: 2022-03-01 | Discharge: 2022-03-01 | Disposition: A | Discharge status: Home / Self Care | Payer: BC Managed Care – PPO | Source: Ambulatory Visit | Attending: Interventional Radiology | Admitting: Interventional Radiology

## 2022-03-01 DIAGNOSIS — D259 Leiomyoma of uterus, unspecified: Secondary | ICD-10-CM

## 2022-03-01 HISTORY — PX: IR RADIOLOGIST EVAL & MGMT: IMG5224

## 2022-03-01 NOTE — Progress Notes (Signed)
Chief Complaint: Patient was seen in consultation today for follow-up after uterine fibroid embolization.  History of Present Illness: Tina Foster is a 36 y.o. female status post uterine fibroid embolization on 05/24/2021.  She has done well after the procedure with significant diminishment in pelvic fullness and pain.  Bleeding with menstrual cycles is fairly similar to prior to the procedure but she experiences less cramping and pain with menstrual cycles.  She denies any abnormal vaginal discharge or fever.  A follow-up MRI of the pelvis was performed on 02/27/2022 and she returns today to review MRI findings.  Past Medical History:  Diagnosis Date   Anxiety    Asthma    Atrial septal aneurysm    Congenitally solitary right kidney    Eczema    Heart murmur    Morbid obesity with BMI of 50.0-59.9, adult [facility])    Uterine fibroid     Past Surgical History:  Procedure Laterality Date   IR ANGIOGRAM PELVIS SELECTIVE OR SUPRASELECTIVE  05/24/2021   IR ANGIOGRAM PELVIS SELECTIVE OR SUPRASELECTIVE  05/24/2021   IR ANGIOGRAM SELECTIVE EACH ADDITIONAL VESSEL  05/24/2021   IR ANGIOGRAM SELECTIVE EACH ADDITIONAL VESSEL  05/24/2021   IR EMBO TUMOR ORGAN ISCHEMIA INFARCT INC GUIDE ROADMAPPING  05/24/2021   IR RADIOLOGIST EVAL & MGMT  03/01/2021   IR RADIOLOGIST EVAL & MGMT  03/22/2021   IR RADIOLOGIST EVAL & MGMT  06/26/2021   IR US GUIDE VASC ACCESS RIGHT  05/24/2021   MYRINGOTOMY WITH TUBE PLACEMENT Bilateral 09/12/2021   Procedure: MYRINGOTOMY WITH TUBE PLACEMENT;  Surgeon: Margaretha Sheffield, MD;  Location: [facility] ORS;  Service: ENT;  Laterality: Bilateral;    Allergies: Eggs or egg-derived products and Fluzone  Medications: Prior to Admission medications   Medication Sig Start Date End Date Taking? Authorizing Provider  desonide (DESOWEN) 0.05 % cream Apply 1 application. topically daily as needed.    [provider]  sertraline (ZOLOFT) 50 MG tablet Take 75 mg by mouth at  bedtime.    [provider]  triamcinolone ointment (KENALOG) 0.1 % Apply 1 application. topically as needed.    [provider]     No family history on file.  Social History   Socioeconomic History   Marital status: Single    Spouse name: Not on file   Number of children: Not on file   Years of education: Not on file   Highest education level: Not on file  Occupational History   Not on file  Tobacco Use   Smoking status: Never   Smokeless tobacco: Never  Vaping Use   Vaping Use: Never used  Substance and Sexual Activity   Alcohol use: Not Currently   Drug use: Never   Sexual activity: Not on file  Other Topics Concern   Not on file  Social History Narrative   Lives alone   Social Determinants of Health   Financial Resource Strain: Not on file  Food Insecurity: Not on file  Transportation Needs: Not on file  Physical Activity: Not on file  Stress: Not on file  Social Connections: Not on file    Review of Systems: A 12 point ROS discussed and pertinent positives are indicated in the HPI above.  All other systems are negative.  Review of Systems  Constitutional: Negative.   Respiratory: Negative.    Cardiovascular: Negative.   Gastrointestinal: Negative.   Genitourinary: Negative.   Musculoskeletal: Negative.   Neurological: Negative.     Vital Signs: BP Marland Kitchen)  156/91 (BP Location: Right Arm)   Pulse 76   Temp 98.5 F (36.9 C) (Oral)   SpO2 98%    Imaging: MR PELVIS W WO CONTRAST  Result Date: 02/27/2022 CLINICAL DATA:  Follow-up uterine fibroids. Approximately 9 months status post uterine artery embolization. EXAM: MRI PELVIS WITHOUT AND WITH CONTRAST TECHNIQUE: Multiplanar multisequence MR imaging of the pelvis was performed both before and after administration of intravenous contrast. CONTRAST:  10 mL Vueway COMPARISON:  03/10/2021 FINDINGS: Lower Urinary Tract: No urinary bladder or urethral abnormality identified. Bowel: Unremarkable  pelvic bowel loops. Vascular/Lymphatic: Unremarkable. No pathologically enlarged pelvic lymph nodes identified. Reproductive: -- Uterus: Measures 16.4 by 9.0 by 13.1 cm (volume = 1000 cm^3). This is decreased from approximately 1500 cm^3 on prior study. Fibroids show decrease in size, with dominant fibroid in the left fundus currently measuring 9.0 cm compared to 11.7 cm previously. All of the larger fibroids show lack of contrast enhancement, with only a few small less than 1 cm fibroids in the lower uterine segment showing some persistent enhancement. No enlarging uterine masses identified. No abnormal endometrial thickening. Normal appearance of cervix. -- Right ovary: Appears normal. No ovarian or adnexal masses identified. -- Left ovary: Appears normal. No ovarian or adnexal masses identified. Other: No peritoneal thickening or abnormal free fluid. Musculoskeletal:  Unremarkable. IMPRESSION: Significant interval decrease in overall uterine volume and size of individual fibroids, as described above. No enlarging uterine masses identified. Normal appearance of both ovaries. No adnexal mass identified. Electronically Signed   By: Marlaine Hind M.D.   On: 02/27/2022 14:49    Labs:  CBC: Recent Labs    05/24/21 0750  WBC 7.9  HGB 13.0  HCT 39.7  PLT 269    COAGS: Recent Labs    05/24/21 0750  INR 1.0    BMP: Recent Labs    05/24/21 0750  NA 136  K 4.8  CL 105  CO2 24  GLUCOSE 91  BUN 12  CALCIUM 8.7*  CREATININE 0.69  GFRNONAA >60     Assessment and Plan:  I met with [name] and we reviewed the recent follow-up MRI.  Based on my measurements of the uterus on the current MRI of 15.9 x 7.9 x 13.3 cm, estimated uterine volume is 869 mL.  This is compared to estimated uterine volume on the preprocedural MRI of approximately 1,366 mL.  Most of the uterine fibroids demonstrate lack of enhancement consistent with necrosis after particle embolization.  There are a few tiny lower  uterine segment fibroids that demonstrate some persistent mild enhancement.  [name] is doing well after fibroid embolization with excellent result both clinically and by imaging.  I told her that at her age, there is a risk of developing new fibroids prior to menopause that could become symptomatic.  However, hopefully these fibroids will not become as large as her previous large symptomatic fibroids and she can avoid a second intervention in the future.  I recommended that she continue routine health maintenance including routine gynecologic care with Dr. Charlesetta Garibaldi.  Electronically Signed: Azzie Roup 03/01/2022, 10:16 AM    I spent a total of 15 Minutes in face to face in clinical consultation, greater than 50% of which was counseling/coordinating care post uterine fibroid embolization.

## 2022-07-12 ENCOUNTER — Emergency Department
Admission: EM | Admit: 2022-07-12 | Discharge: 2022-07-12 | Disposition: A | Discharge status: Home / Self Care | Payer: BC Managed Care – PPO | Attending: Emergency Medicine | Admitting: Emergency Medicine

## 2022-07-12 ENCOUNTER — Other Ambulatory Visit: Payer: Self-pay

## 2022-07-12 DIAGNOSIS — R200 Anesthesia of skin: Secondary | ICD-10-CM | POA: Diagnosis present

## 2022-07-12 LAB — CBC WITH DIFFERENTIAL/PLATELET
Abs Immature Granulocytes: 0.02 10*3/uL (ref 0.00–0.07)
Basophils Absolute: 0 10*3/uL (ref 0.0–0.1)
Basophils Relative: 1 %
Eosinophils Absolute: 0.1 10*3/uL (ref 0.0–0.5)
Eosinophils Relative: 1 %
HCT: 37.5 % (ref 36.0–46.0)
Hemoglobin: 12.3 g/dL (ref 12.0–15.0)
Immature Granulocytes: 0 %
Lymphocytes Relative: 33 %
Lymphs Abs: 2.3 10*3/uL (ref 0.7–4.0)
MCH: 28.7 pg (ref 26.0–34.0)
MCHC: 32.8 g/dL (ref 30.0–36.0)
MCV: 87.4 fL (ref 80.0–100.0)
Monocytes Absolute: 0.7 10*3/uL (ref 0.1–1.0)
Monocytes Relative: 9 %
Neutro Abs: 4 10*3/uL (ref 1.7–7.7)
Neutrophils Relative %: 56 %
Platelets: 295 10*3/uL (ref 150–400)
RBC: 4.29 MIL/uL (ref 3.87–5.11)
RDW: 12.6 % (ref 11.5–15.5)
WBC: 7.1 10*3/uL (ref 4.0–10.5)
nRBC: 0 % (ref 0.0–0.2)

## 2022-07-12 LAB — COMPREHENSIVE METABOLIC PANEL
ALT: 16 U/L (ref 0–44)
AST: 17 U/L (ref 15–41)
Albumin: 4 g/dL (ref 3.5–5.0)
Alkaline Phosphatase: 76 U/L (ref 38–126)
Anion gap: 5 (ref 5–15)
BUN: 14 mg/dL (ref 6–20)
CO2: 28 mmol/L (ref 22–32)
Calcium: 9.2 mg/dL (ref 8.9–10.3)
Chloride: 104 mmol/L (ref 98–111)
Creatinine, Ser: 0.84 mg/dL (ref 0.44–1.00)
GFR, Estimated: >60 mL/min (ref >60)
Glucose, Bld: 86 mg/dL (ref 70–99)
Potassium: 4.3 mmol/L (ref 3.5–5.1)
Sodium: 137 mmol/L (ref 135–145)
Total Bilirubin: 0.7 mg/dL (ref 0.3–1.2)
Total Protein: 7.4 g/dL (ref 6.5–8.1)

## 2022-07-12 LAB — TSH: TSH: 2.129 u[IU]/mL (ref 0.350–4.500)

## 2022-07-12 MED ORDER — HYDROXYZINE HCL 25 MG PO TABS
25.0000 mg | ORAL_TABLET | Freq: Once | ORAL | Status: AC
  Administered 2022-07-12: 25 mg via ORAL
  Filled 2022-07-12: qty 1

## 2022-07-12 MED ORDER — HYDROXYZINE HCL 25 MG PO TABS: 25.0000 mg | ORAL_TABLET | Freq: Three times a day (TID) | ORAL | 0 refills | Status: AC | PRN

## 2022-07-12 NOTE — ED Triage Notes (Signed)
Pt to ED from home for "numbness from feet to abdomen x 2 days". Pt states "I think my fibroids are pressing on something as they are on mu uterus". Pt is CAOx4 and in no acute distress at this time. Ambulatory in triage.

## 2022-07-12 NOTE — ED Provider Notes (Signed)
[facility] Provider Note    Event Date/Time   First MD Initiated Contact with Patient 07/12/22 1938     (approximate)   History   Numbness (Feet to abdomen x2days)   HPI  Tina Foster is a 37 y.o. female who presents to the emergency department to get a because of concerns for numbness.  The patient states that it started about 2 days ago.  It is located from her feet to her mid abdomen.  Is located on both sides.  She also feels it in her hands.  She describes it as a sensation of numbing medicine like you get at the dentist.  She denies any pins-and-needles.  No apparent weakness or discoordination with this numbness.  She did state that it did start roughly the same time that her period started so she did have some concern it might be related to her uterine fibroids.     Physical Exam   Triage Vital Signs: ED Triage Vitals  Enc Vitals Group     BP 07/12/22 1916 (!) 161/82     Pulse Rate 07/12/22 1916 73     Resp 07/12/22 1916 16     Temp 07/12/22 1916 98.1 F (36.7 C)     Temp Source 07/12/22 1916 Oral     SpO2 07/12/22 1916 100 %     Weight 07/12/22 1915 (!) 330 lb (149.7 kg)     Height 07/12/22 1915 '5\' 5"'$  (1.651 m)     Head Circumference --      Peak Flow --      Pain Score 07/12/22 1915 0     Pain Loc --      Pain Edu? --      Excl. in [place]? --     Most recent vital signs: Vitals:   07/12/22 1916  BP: (!) 161/82  Pulse: 73  Resp: 16  Temp: 98.1 F (36.7 C)  SpO2: 100%   General: Awake, alert, oriented. CV:  Good peripheral perfusion. Regular rate and rhythm. Resp:  Normal effort. Lungs clear. Abd:  No distention.  Neuro:  PERRL. EOMI. Face symmetric. Strength 5/5 in upper and lower extremities. Sensation grossly intact in all extremities.    ED Results / Procedures / Treatments   Labs (all labs ordered are listed, but only abnormal results are displayed) Labs Reviewed  CBC WITH DIFFERENTIAL/PLATELET  TSH  COMPREHENSIVE  METABOLIC PANEL     EKG  None   RADIOLOGY None   PROCEDURES:  Critical Care performed: No  Procedures   MEDICATIONS ORDERED IN ED: Medications - No data to display   IMPRESSION / MDM / [employer] / ED COURSE  I reviewed the triage vital signs and the nursing notes.                              Differential diagnosis includes, but is not limited to,   Patient's presentation is most consistent with acute presentation with potential threat to life or bodily function.  Patient presented to the emergency department today because of concerns for numbness.  Bilateral legs lower abdomen and hands.  Given distribution of the patient's symptoms I have low concern for ACS.  The patient's blood work without any concerning electrolyte abnormalities.  Unclear etiology of the patient's symptoms. Will plan on having patient follow up with neurology.    FINAL CLINICAL IMPRESSION(S) / ED DIAGNOSES   Final diagnoses:  Numbness     Note:  This document was prepared using Dragon voice recognition software and may include unintentional dictation errors.    Nance Pear, MD 07/12/22 [id number]    Nance Pear, MD 07/14/22 0001

## 2022-07-12 NOTE — ED Notes (Signed)
Urine sent to lab 

## 2022-07-12 NOTE — Discharge Instructions (Signed)
Please seek medical attention for any high fevers, chest pain, shortness of breath, change in behavior, persistent vomiting, bloody stool or any other new or concerning symptoms.  

## 2022-07-13 NOTE — ED Provider Notes (Signed)
Care assumed of patient from outgoing provider.  See their note for initial history, exam and plan.  Clinical Course as of 07/13/22 0410  Fri Jul 12, 2022  2308 Numbness in a non-neurologic distribution - stressful like situation currently.  Labs reassuring and will follow up with neurology as an outpatient.  [SM]    Clinical Course User Index [SM] Nathaniel Man, MD     Nathaniel Man, MD 07/13/22 [phone]
